# Patient Record
Sex: Female | Born: 1935 | Race: White | Hispanic: No | Marital: Single | Smoking: Former smoker
Health system: Southern US, Community
[De-identification: ages and names within clinical notes are randomized; demographics above are authoritative.]

## PROBLEM LIST (undated history)

## (undated) DIAGNOSIS — T7840XA Allergy, unspecified, initial encounter: Secondary | ICD-10-CM

## (undated) DIAGNOSIS — I10 Essential (primary) hypertension: Secondary | ICD-10-CM

## (undated) DIAGNOSIS — Z862 Personal history of diseases of the blood and blood-forming organs and certain disorders involving the immune mechanism: Secondary | ICD-10-CM

## (undated) DIAGNOSIS — A499 Bacterial infection, unspecified: Secondary | ICD-10-CM

## (undated) DIAGNOSIS — R32 Unspecified urinary incontinence: Secondary | ICD-10-CM

## (undated) DIAGNOSIS — B379 Candidiasis, unspecified: Secondary | ICD-10-CM

## (undated) DIAGNOSIS — I8390 Asymptomatic varicose veins of unspecified lower extremity: Secondary | ICD-10-CM

## (undated) DIAGNOSIS — IMO0002 Reserved for concepts with insufficient information to code with codable children: Secondary | ICD-10-CM

## (undated) DIAGNOSIS — E079 Disorder of thyroid, unspecified: Secondary | ICD-10-CM

## (undated) DIAGNOSIS — D649 Anemia, unspecified: Secondary | ICD-10-CM

## (undated) HISTORY — DX: Reserved for concepts with insufficient information to code with codable children: IMO0002

## (undated) HISTORY — DX: Essential (primary) hypertension: I10

## (undated) HISTORY — PX: JOINT REPLACEMENT: SHX530

## (undated) HISTORY — DX: Candidiasis, unspecified: B37.9

## (undated) HISTORY — DX: Asymptomatic varicose veins of unspecified lower extremity: I83.90

## (undated) HISTORY — PX: OVARIAN CYST REMOVAL: SHX89

## (undated) HISTORY — DX: Allergy, unspecified, initial encounter: T78.40XA

## (undated) HISTORY — DX: Unspecified urinary incontinence: R32

## (undated) HISTORY — PX: TONSILLECTOMY: SUR1361

## (undated) HISTORY — DX: Personal history of diseases of the blood and blood-forming organs and certain disorders involving the immune mechanism: Z86.2

## (undated) HISTORY — DX: Anemia, unspecified: D64.9

## (undated) HISTORY — PX: ABDOMINAL HYSTERECTOMY: SHX81

## (undated) HISTORY — DX: Bacterial infection, unspecified: A49.9

## (undated) HISTORY — DX: Disorder of thyroid, unspecified: E07.9

---

## 2003-10-04 ENCOUNTER — Encounter: Admission: RE | Admit: 2003-10-04 | Discharge: 2003-10-04 | Payer: Self-pay | Admitting: Family Medicine

## 2004-05-23 ENCOUNTER — Ambulatory Visit: Payer: Self-pay | Admitting: Family Medicine

## 2004-06-13 ENCOUNTER — Ambulatory Visit: Payer: Self-pay | Admitting: Family Medicine

## 2004-08-30 ENCOUNTER — Ambulatory Visit: Payer: Self-pay | Admitting: Internal Medicine

## 2004-09-03 ENCOUNTER — Ambulatory Visit: Payer: Self-pay | Admitting: Internal Medicine

## 2005-04-05 ENCOUNTER — Encounter (INDEPENDENT_AMBULATORY_CARE_PROVIDER_SITE_OTHER): Payer: Self-pay | Admitting: Specialist

## 2005-04-05 ENCOUNTER — Inpatient Hospital Stay (HOSPITAL_COMMUNITY): Admission: EM | Admit: 2005-04-05 | Discharge: 2005-04-07 | Payer: Self-pay | Admitting: Emergency Medicine

## 2005-04-05 ENCOUNTER — Ambulatory Visit: Payer: Self-pay | Admitting: Internal Medicine

## 2005-06-12 ENCOUNTER — Ambulatory Visit: Payer: Self-pay | Admitting: Internal Medicine

## 2005-06-14 ENCOUNTER — Ambulatory Visit: Payer: Self-pay | Admitting: Internal Medicine

## 2006-12-11 ENCOUNTER — Encounter (INDEPENDENT_AMBULATORY_CARE_PROVIDER_SITE_OTHER): Payer: Self-pay | Admitting: *Deleted

## 2007-04-10 ENCOUNTER — Ambulatory Visit: Payer: Self-pay | Admitting: Internal Medicine

## 2007-04-10 DIAGNOSIS — E785 Hyperlipidemia, unspecified: Secondary | ICD-10-CM

## 2007-04-10 DIAGNOSIS — I10 Essential (primary) hypertension: Secondary | ICD-10-CM

## 2007-04-10 DIAGNOSIS — M1990 Unspecified osteoarthritis, unspecified site: Secondary | ICD-10-CM | POA: Insufficient documentation

## 2007-04-10 DIAGNOSIS — E039 Hypothyroidism, unspecified: Secondary | ICD-10-CM

## 2007-04-13 ENCOUNTER — Encounter (INDEPENDENT_AMBULATORY_CARE_PROVIDER_SITE_OTHER): Payer: Self-pay | Admitting: *Deleted

## 2007-04-13 LAB — CONVERTED CEMR LAB
Calcium: 9.3 mg/dL (ref 8.4–10.5)
Chloride: 101 meq/L (ref 96–112)
Potassium: 4 meq/L (ref 3.5–5.1)
TSH: 2.39 microintl units/mL (ref 0.35–5.50)

## 2007-10-09 ENCOUNTER — Telehealth (INDEPENDENT_AMBULATORY_CARE_PROVIDER_SITE_OTHER): Payer: Self-pay | Admitting: *Deleted

## 2007-12-30 ENCOUNTER — Encounter: Admission: RE | Admit: 2007-12-30 | Discharge: 2007-12-30 | Payer: Self-pay | Admitting: General Surgery

## 2007-12-31 ENCOUNTER — Encounter (INDEPENDENT_AMBULATORY_CARE_PROVIDER_SITE_OTHER): Payer: Self-pay | Admitting: General Surgery

## 2007-12-31 ENCOUNTER — Ambulatory Visit (HOSPITAL_BASED_OUTPATIENT_CLINIC_OR_DEPARTMENT_OTHER): Admission: RE | Admit: 2007-12-31 | Discharge: 2007-12-31 | Payer: Self-pay | Admitting: General Surgery

## 2010-04-22 ENCOUNTER — Encounter: Payer: Self-pay | Admitting: Family Medicine

## 2010-08-14 NOTE — Op Note (Signed)
Olivia Robinson, MACRAE NO.:  [id number]   MEDICAL RECORD NO.:  [id number]          PATIENT TYPE:  AMB   LOCATION:  DSC                          FACILITY:  [facility]   PHYSICIAN:  Juanetta Gosling, MDDATE OF BIRTH:  [birth year]   DATE OF PROCEDURE:  12/31/2007  DATE OF DISCHARGE:                               OPERATIVE REPORT   PREOPERATIVE DIAGNOSIS:  Hemorrhoids.   POSTOPERATIVE DIAGNOSES:  External and internal hemorrhoidal disease.   PROCEDURE:  Exam under anesthesia, internal hemorrhoidectomy, excision  of external anal skin tag, and banding of single internal hemorrhoid.   SURGEON:  Troy Sine. Dwain Sarna, MD   ASSISTANT:  Amber L. Freida Busman, MD   ANESTHESIA:  General.   Position prone jackknife .   SPECIMENS:  Hemorrhoid to pathology.   ESTIMATED BLOOD LOSS:  Minimal.   COMPLICATIONS:  None.   DRAINS:  None.   DISPOSITION:  To PACU in stable condition.   INDICATION:  Olivia Robinson is a 76 year old female who for number of years  has had occasionally prolapsing internal hemorrhoids that very  occasionally required manual reduction.  Her main compliant she also had  difficulty with anal hygiene associated with an area that has increased  in size during this time.  On her exam in the clinic, she had a large  anal skin tag in the right anterior position and on her anoscopy.  She  has internal hemorrhoids in all 3 positions that are moderate in size.  She did not have mass or any blood on digital rectal examination.  I  counseled her for exam under anesthesia, excision of a skin tag,  possible internal hemorrhoidectomy or a hemorrhoidal banding.   PROCEDURE:  After informed consent was obtained, the patient was taken  to the operating room where she was placed under general anesthesia  without complication.  Sequential compressive device was placed on her  lower extremities.  She was then rolled into prone jackknife position  and appropriately padded.  Her  buttocks were then taped apart and exam  was performed.  She had noted that she had a prolapsed right anterior  and right posterior internal hemorrhoid as well as an external skin tag  associated in the posterior position today.  Her anus and buttocks were  then prepped and draped in a standard sterile surgical fashion.  Anoscopy was then performed with the same findings as above.  There were  no masses noted with this.  I elected to excise the right posterior  internal hemorrhoid along with the external component along with this  including the skin tag.  I put a 2-0 chromic stitch at the apex of this  and then removed the hemorrhoidal tissue off the sphincter muscle  without difficulty.  This was passed off the table as a specimen.  I  then oversewed the area with a chromic leaving the most distal portion  of this open.  Following this, I reexamined the other area that seemed  to be bothering her the most that was in the right anterior position.  There was a large internal hemorrhoid associated with  this with a small  external tag today.  I elected to rubber band this hemorrhoidal complex  and did so without complication.  She does have hemorrhoids in other  positions and has some other external disease as well, but I think these  2 areas were the areas that were bothering her the most and did not want  to pursue treatment of any of these others at this time.  She did insert  a piece of Gelfoam into her anus and then performed an anal block with  0.25% Marcaine following the completion of the procedure.  She tolerated  this well, was rolled supine and extubated in the operating room and  transferred to the PACU in stable condition.  She would return to my  office in 2-3 weeks for reevaluation as she may yet require further  banding at that time or later in the future just depending on what her  symptoms will be like, but I think she will be considerably better from  this.       Juanetta Gosling, MD  Electronically Signed     MCW/MEDQ  D:  12/31/2007  T:  01/01/2008  Job:  [id number]   cc:   Clydie Braun L. Hal Hope, M.D.

## 2010-08-17 NOTE — Discharge Summary (Signed)
[name], Robinson NO.:  [id number]   MEDICAL RECORD NO.:  [id number]          PATIENT TYPE:  INP   LOCATION:  1617                         FACILITY:  [facility]   PHYSICIAN:  Rosalyn Gess. Norins, M.D. LHCDATE OF BIRTH:  [birth year]   DATE OF ADMISSION:  04/05/2005  DATE OF DISCHARGE:  04/07/2005                                 DISCHARGE SUMMARY   ADMITTING DIAGNOSES:  1.  Lower gastrointestinal bleed.  2.  Hypertension.   DISCHARGE DIAGNOSES:  1.  Diarrhea secondary to either ischemic colitis versus infectious colitis.  2.  Hypertension, controlled.   CONSULTANTS:  Dr. Lina Sar for GI.   PROCEDURES:  None.   HISTORY OF PRESENT ILLNESS:  Olivia Robinson is a 76 year old woman with for 4-  day history of frequent liquid stools with mucus and increasing lower  abdominal pain and discomfort. On the morning of admission she had a dark  red, bloody stools with clots. She presented to the emergency department for  evaluation at that point.   Past medical history, family history, social history are documented in the  admission note.   HOSPITAL COURSE:  1.  Gastrointestinal.  The patient was admitted with probable lower GI      bleed. Her hemoglobin at initial presentation was 13.5 g and fell with a      nadir of 11.1 grams. After admission the patient had no further      hematochezia.   The patient was seen in consultation by Dr. Lina Sar.  Concern was for  ischemic colitis versus viral gastroenteritis with possible cause of  ischemia versus C. difficile.  Stool cultures were obtained and at time of  discharge one for two was negative for C. difficile, one pending. Stool was  negative for wbc's or ova and parasites.   With the patient taking a diet of full liquids without difficulty with no  recurrent lower GI bleed, she is felt to be stable for discharge home and to  continue empiric treatment with of Flagyl and Cipro.   1.  Hypertension. The patient was  continued on her home medications and her      blood pressure was stable.   DISCHARGE EXAMINATION:  VITAL SIGNS:  Temperature of 97, blood pressure  99/55, heart rate 57, respirations 16.  GENERAL APPEARANCE:  This is an overweight Caucasian woman sitting on the  side of the bed in no acute distress.  CHEST: Clear.  ABDOMEN: Soft, no guarding or rebound.   No further exam conducted.   FINAL LABORATORY:  Hemoglobin 11.1, hematocrit 32.8 on the day of discharge.  final chemistries from April 06, 2005 were unremarkable with a creatinine  of 1.1. Stool studies were negative as noted. Liver functions at admission  were stable.   DISPOSITION:  The patient is discharged home. She will continue on her home  medications include lisinopril 20 milligrams daily, hydrochlorothiazide 12.5  milligrams daily, Synthroid 100 mcg daily. She will continue on Flagyl 250  milligrams t.i.d. for 7 days, Cipro 500 milligrams b.i.d. for 7 days. The  patient will see her primary care  physician, Dr. Drue Novel in followup. She will  see Dr. Lina Sar for followup as instructed.   The patient's condition at time of discharge dictation is stable and  improved.           ______________________________  Rosalyn Gess Norins, M.D. [facility]     MEN/MEDQ  D:  04/07/2005  T:  04/08/2005  Job:  [id number]   cc:   Wanda Plump, MD [facility]  [phone] [place], [place] [address]   Lina Sar, M.D. [facility]  [address]  [place]  [place] [address]

## 2010-10-26 ENCOUNTER — Other Ambulatory Visit: Payer: Self-pay | Admitting: Orthopaedic Surgery

## 2010-10-26 ENCOUNTER — Other Ambulatory Visit (HOSPITAL_COMMUNITY): Payer: Self-pay | Admitting: Orthopaedic Surgery

## 2010-10-26 ENCOUNTER — Encounter (HOSPITAL_COMMUNITY): Payer: Medicare Other

## 2010-10-26 ENCOUNTER — Ambulatory Visit (HOSPITAL_COMMUNITY)
Admission: RE | Admit: 2010-10-26 | Discharge: 2010-10-26 | Disposition: A | Discharge status: Home / Self Care | Payer: Medicare Other | Source: Ambulatory Visit | Attending: Orthopaedic Surgery | Admitting: Orthopaedic Surgery

## 2010-10-26 DIAGNOSIS — Z01812 Encounter for preprocedural laboratory examination: Secondary | ICD-10-CM | POA: Insufficient documentation

## 2010-10-26 DIAGNOSIS — Z01810 Encounter for preprocedural cardiovascular examination: Secondary | ICD-10-CM | POA: Insufficient documentation

## 2010-10-26 DIAGNOSIS — Z87891 Personal history of nicotine dependence: Secondary | ICD-10-CM | POA: Insufficient documentation

## 2010-10-26 DIAGNOSIS — Z01818 Encounter for other preprocedural examination: Secondary | ICD-10-CM

## 2010-10-26 DIAGNOSIS — I10 Essential (primary) hypertension: Secondary | ICD-10-CM | POA: Insufficient documentation

## 2010-10-26 DIAGNOSIS — I498 Other specified cardiac arrhythmias: Secondary | ICD-10-CM | POA: Insufficient documentation

## 2010-10-26 LAB — URINALYSIS, ROUTINE W REFLEX MICROSCOPIC
Glucose, UA: NEGATIVE mg/dL
Hgb urine dipstick: NEGATIVE
Ketones, ur: NEGATIVE mg/dL
Protein, ur: NEGATIVE mg/dL
Urobilinogen, UA: 0.2 mg/dL (ref 0.0–1.0)
pH: 5.5 (ref 5.0–8.0)

## 2010-10-26 LAB — BASIC METABOLIC PANEL
CO2: 27 mEq/L (ref 19–32)
GFR calc Af Amer: >60 mL/min (ref >60)
GFR calc non Af Amer: >60 mL/min (ref >60)

## 2010-10-26 LAB — CBC
HCT: 42.4 % (ref 36.0–46.0)
Hemoglobin: 13.4 g/dL (ref 12.0–15.0)
MCH: 28.6 pg (ref 26.0–34.0)
MCHC: 31.6 g/dL (ref 30.0–36.0)
MCV: 90.4 fL (ref 78.0–100.0)

## 2010-10-26 LAB — URINE MICROSCOPIC-ADD ON

## 2010-11-02 ENCOUNTER — Inpatient Hospital Stay (HOSPITAL_COMMUNITY): Payer: Medicare Other

## 2010-11-02 ENCOUNTER — Inpatient Hospital Stay (HOSPITAL_COMMUNITY)
Admission: RE | Admit: 2010-11-02 | Discharge: 2010-11-06 | DRG: 470 | Disposition: A | Discharge status: Skilled Nursing Facility | Payer: Medicare Other | Source: Ambulatory Visit | Attending: Orthopaedic Surgery | Admitting: Orthopaedic Surgery

## 2010-11-02 DIAGNOSIS — M169 Osteoarthritis of hip, unspecified: Principal | ICD-10-CM | POA: Diagnosis present

## 2010-11-02 DIAGNOSIS — Z01812 Encounter for preprocedural laboratory examination: Secondary | ICD-10-CM

## 2010-11-02 DIAGNOSIS — D62 Acute posthemorrhagic anemia: Secondary | ICD-10-CM | POA: Diagnosis not present

## 2010-11-02 DIAGNOSIS — E039 Hypothyroidism, unspecified: Secondary | ICD-10-CM | POA: Diagnosis present

## 2010-11-02 DIAGNOSIS — I10 Essential (primary) hypertension: Secondary | ICD-10-CM | POA: Diagnosis present

## 2010-11-02 DIAGNOSIS — M1610 Unilateral primary osteoarthritis, unspecified hip: Principal | ICD-10-CM | POA: Diagnosis present

## 2010-11-02 LAB — TYPE AND SCREEN
ABO/RH(D): A NEG
Antibody Screen: NEGATIVE

## 2010-11-03 LAB — BASIC METABOLIC PANEL
BUN: 16 mg/dL (ref 6–23)
Chloride: 104 mEq/L (ref 96–112)
Creatinine, Ser: 0.65 mg/dL (ref 0.50–1.10)
Glucose, Bld: 136 mg/dL — ABNORMAL HIGH (ref 70–99)
Potassium: 4.5 mEq/L (ref 3.5–5.1)
Sodium: 137 mEq/L (ref 135–145)

## 2010-11-03 LAB — CBC
HCT: 30.7 % — ABNORMAL LOW (ref 36.0–46.0)
MCHC: 33.6 g/dL (ref 30.0–36.0)
Platelets: 279 10*3/uL (ref 150–400)

## 2010-11-04 LAB — CBC
HCT: 32.4 % — ABNORMAL LOW (ref 36.0–46.0)
Hemoglobin: 10.2 g/dL — ABNORMAL LOW (ref 12.0–15.0)
MCH: 28.8 pg (ref 26.0–34.0)
MCHC: 31.5 g/dL (ref 30.0–36.0)
MCV: 91.5 fL (ref 78.0–100.0)
Platelets: 293 K/uL (ref 150–400)
RBC: 3.54 MIL/uL — ABNORMAL LOW (ref 3.87–5.11)
RDW: 14.5 % (ref 11.5–15.5)
WBC: 10.3 K/uL (ref 4.0–10.5)

## 2010-11-04 LAB — BASIC METABOLIC PANEL WITH GFR
BUN: 15 mg/dL (ref 6–23)
CO2: 27 meq/L (ref 19–32)
Calcium: 9.5 mg/dL (ref 8.4–10.5)
Chloride: 104 meq/L (ref 96–112)
Creatinine, Ser: 0.72 mg/dL (ref 0.50–1.10)
GFR calc Af Amer: >60 mL/min
GFR calc non Af Amer: >60 mL/min
Glucose, Bld: 118 mg/dL — ABNORMAL HIGH (ref 70–99)
Potassium: 3.9 meq/L (ref 3.5–5.1)
Sodium: 140 meq/L (ref 135–145)

## 2010-11-05 LAB — CBC
HCT: 28.4 % — ABNORMAL LOW (ref 36.0–46.0)
Hemoglobin: 9.4 g/dL — ABNORMAL LOW (ref 12.0–15.0)
MCH: 29.7 pg (ref 26.0–34.0)
MCV: 89.9 fL (ref 78.0–100.0)
RBC: 3.16 MIL/uL — ABNORMAL LOW (ref 3.87–5.11)

## 2010-11-05 LAB — BASIC METABOLIC PANEL
BUN: 15 mg/dL (ref 6–23)
Calcium: 9 mg/dL (ref 8.4–10.5)
Glucose, Bld: 121 mg/dL — ABNORMAL HIGH (ref 70–99)

## 2010-11-08 NOTE — H&P (Signed)
  NAMEMURLEAN, Olivia Robinson NO.:  [id number]  MEDICAL RECORD NO.:  [id number]  LOCATION:  1614                         FACILITY:  [facility]  PHYSICIAN:  Vanita Panda. Magnus Ivan, M.D.DATE OF BIRTH:  01/03/1935  DATE OF ADMISSION:  11/02/2010 DATE OF DISCHARGE:                             HISTORY & PHYSICAL   CHIEF COMPLAINT:  Severe left hip pain and known severe osteoarthritis.  HISTORY OF PRESENT ILLNESS:  Ms. Olivia Robinson is a 76 year old female with debilitating arthritis involving her left hip.  She has had a steroid injection in that hip and it has helped a little, but now it has gotten where it is greatly affecting her activities of daily living.  Her left hip hurts daily.  It is affecting her activities of daily living in terms of when she walks and sleeps and getting up from a sitting position.  Driving has been a problem as well.  She now wishes to proceed with a total hip arthroplasty.  She understands the risks and benefits of this and we have talked in the office about this in length.  PAST MEDICAL HISTORY: 1. High blood pressure. 2. Thyroid disease.  MEDICATIONS:  Synthroid and lisinopril.  ALLERGIES:  CLINDAMYCIN.  FAMILY MEDICAL HISTORY:  Heart disease.  SOCIAL HISTORY:  She is divorced.  She does not smoke and does not drink.  Lives alone.  REVIEW OF SYSTEMS:  Negative for chest pain, shortness of breath, fever, chills, nausea, and vomiting.  PHYSICAL EXAMINATION:  VITAL SIGNS:  She is afebrile.  Stable vital signs. GENERAL:  She is alert, oriented x3, in no acute distress. HEENT:  Normocephalic, atraumatic.  Pupils are equal, round, and reactive to light.  Extraocular muscles intact. NECK:  Supple.  No JVD.  No bruits. LUNGS:  Clear to auscultation bilaterally. HEART:  Regular rate and rhythm. ABDOMEN:  Benign. EXTREMITIES:  Left hip shows severe pain with internal and external rotation.  LABORATORY DATA:  X-rays of her left confirmed  end-stage arthritis of the left hip.  ASSESSMENT:  This is a very pleasant 76 year old female with end-stage arthritis of the left hip.  PLAN:  We will proceed today with a total hip arthroplasty and then she will admitted as an inpatient.  Again, she understands the risks of acute blood loss anemia, DVT, and PE, and she does wish to proceed with surgery.     Vanita Panda. Magnus Ivan, M.D.     CYB/MEDQ  D:  11/02/2010  T:  11/03/2010  Job:  [id number]  Electronically Signed by Doneen Poisson M.D. on 11/08/2010 11:41:48 PM

## 2010-11-08 NOTE — Op Note (Signed)
Olivia Robinson, Olivia Robinson NO.:  [id number]  MEDICAL RECORD NO.:  [id number]  LOCATION:  1614                         FACILITY:  [facility]  PHYSICIAN:  Vanita Panda. Magnus Ivan, M.D.DATE OF BIRTH:  [birth year]  DATE OF PROCEDURE:  11/02/2010 DATE OF DISCHARGE:                              OPERATIVE REPORT   PREOPERATIVE DIAGNOSIS:  Severe osteoarthritis and degenerative joint disease, left  hip.  POSTOPERATIVE DIAGNOSIS:  Severe osteoarthritis and degenerative joint disease, left  hip.  PROCEDURE:  Left total hip arthroplasty through direct anterior approach.  IMPLANTS:  DePuy Pinn[employer] Sector acetabular component with Gription size 52, size 36 +4 neutral polyethylene liner, Corail femoral component size 10 with standard offset and HA-coating, size 36 -2 metal hip ball.  SURGEON:  Vanita Panda. Magnus Ivan, M.D.  ASSISTANT:  Wende Neighbors, P.A.  BLOOD LOSS:  700 mL.  ANTIBIOTICS:  IV Ancef.  COMPLICATIONS:  None.  INDICATION:  Olivia Robinson is a 76 year old female with debilitating arthritis involving her left hip, this is effecting her activities of daily living.  It has gotten to the point  where she wished to proceed with a total hip arthroplasty.  She understands the risk and benefits of this including the risk of acute blood loss anemia, DVT, and PE.  PROCEDURE DESCRIPTION:  After informed consent was obtained, the appropriate left hip was marked.  She was brought to the operating room and general anesthesia was obtained while she was on a stretcher.  A Foley catheter was placed and then both feet were placed in traction boots.  She was then placed on the Hana operative table with perineal post in place in both legs in midline skeletal traction with no traction applied.  Her left hip was then prepped and draped with DuraPrep and sterile drapes.  A time-out was called.  She was identified as the correct patient, and correct left hip.  I then made an  incision 1 cm distal and around 3 cm posterior to the anterior-superior iliac spine and carried this obliquely down the leg.  I  was able to dissect down to the tensor fascia lata.  The tensor fascia lata was divided longitudinally.  I then proceed with a direct anterior approach to the hip.  A Cobra retractor was placed around the lateral neck and then medial up underneath the reflected head of the rectus femoris.  I cauterized the lateral femoral circumflex vessels.  I then was then able to divide the hip capsule in a T-type format and found a large joint effusion.  Under direct fluoroscopic guidance, we then assessed the level of femoral neck cut, and then on direct visualization, I made a femoral neck cut.  I placed a corkscrew into the femoral head and removed the femoral head without problems.  I then cleaned the acetabulum of debris.  We placed the Hohmann retractor anteriorly and the Cobra rectractor posteriorly down the acetabulum.  I then began reaming from size 42 all the way up to a size of 51 reamer with the last 2 reamers placed under direct fluoroscopy.  Under direct visualization of fluoroscopy,  I was then able to place the size 52 acetabular component with Gription.  We placed the hole eliminator and then the real 36 +4 neutral polyethylene liner.  Next, the attention was turned to the femur with all the traction off.  We placed hook underneath the muscle around the femur supported.  We externally rotated to 90 degrees, extended and adducted the hip to allow exposure to the femoral canal with Mueller retractor placed in the medial calcar and Hohmann retractor underneath the greater trochanter.  I was able to release the lateral capsule and bring this anterior; I then was able to release soft tissue from around the greater trochanter to allow exposure to the femoral canal.  I then used the box cutting guide followed by rongeur and then we began broaching from the size 8  broach up to the 10  broach; the 10 broach was found to be most stable.  I trialed a trial 36 +1.5 hip ball, but it was too long, with the -2 trial hip ball, we reduced this in the acetabulum and leg lengths were equal and it was felt that she was otherwise stable with external and internal rotation with no shuck.  I then removed all trial instrumentation.  After dislocating the hip again, we placed a real size 10 femoral component followed by the real 36 -2 metal hip ball.  We reduced this back in the acetabulum and again it was stable with no shuck and stayed in passed 45 degrees of internal rotation and 90 degrees of external rotation.  All the final fluoroscopy pictures showed good placement of the implants well.  We then irrigated the tissues, closed the capsule with interrupted #1 Ethibond suture followed by a running #1 Vicryl in the tensor fascia lata, 2-0 Vicryl in the subcutaneous tissue, and staples on the skin.  Xeroform followed by well-padded sterile dressing was applied.  She was taken off the Hana table, awakened, extubated, and taken to the recovery room in stable condition.  All final counts were correct, and there were no complications noted.  Wende Neighbors, P.A., was all involved during this case.  She was there from incision to closure and was integral in helping get the implants in a proper position.     Vanita Panda. Magnus Ivan, M.D.     CYB/MEDQ  D:  11/02/2010  T:  11/03/2010  Job:  [id number]  Electronically Signed by Doneen Poisson M.D. on 11/08/2010 11:41:46 PM

## 2010-11-23 NOTE — Discharge Summary (Signed)
Olivia Robinson, Olivia NO.:  [id number]  MEDICAL RECORD NO.:  [id number]  LOCATION:  1614                         FACILITY:  [facility]  PHYSICIAN:  Vanita Panda. Magnus Ivan, M.D.DATE OF BIRTH:  1934/05/13  DATE OF ADMISSION:  11/02/2010 DATE OF DISCHARGE:  11/06/2010                              DISCHARGE SUMMARY   ADMISSION DIAGNOSES: 1. Severe osteoarthritis of the left hip. 2. Hypertension. 3. Thyroid disease.  DISCHARGE DIAGNOSES: 1. Severe osteoarthritis of the left hip. 2. Hypertension. 3. Thyroid disease. 4. Posthemorrhagic anemia.  PROCEDURE:  On November 02, 2010, the patient underwent left total hip arthroplasty through direct anterior approach performed by Dr. Magnus Ivan, assisted by Maud Deed, PA-C under general anesthesia.  CONSULTATIONS:  None.  BRIEF HISTORY:  The patient is a 76 year old female with debilitating arthritis of her left hip who has osteoarthritis and degenerative joint disease.  She has difficulty with her usual activities.  She has failed conservative treatment.  After risks and benefits were discussed with the patient, she wished to proceed with a left total hip arthroplasty.  BRIEF HOSPITAL COURSE:  The patient tolerated the procedure under general anesthesia without complications.  Postoperatively, neurovascular motor function of the lower extremities was noted to be intact.  The patient was started on physical therapy for ambulation and gait training, weightbearing as tolerated on the operative extremity. No hip replacement precautions were necessary secondary to direct anterior approach.  Postoperative hemoglobin dropped to the lowest value at 9.4 and 28.4 and the patient did not require blood transfusion. Chemistry studies remained stable throughout the hospital stay.  The patient was placed on Xarelto for DVT prophylaxis.  She will continue on Xarelto for a total of 14 days postoperatively.  Dressing changes were done  daily and her wound was healing well without drainage or signs of infection.  She was afebrile and vital signs were stable through the hospital stay.  The patient was able to take a regular diet.  PCA analgesics were used initially and she was weaned to p.o. analgesics without difficulty.  Foley catheter was discontinued and the patient was voiding independently.  She was concerned about returning to her home on an independent level.  It was felt that she would best benefit from a short-term nursing home placement for rehabilitation.  The patient did ambulate 150 feet while in the hospital utilizing a rolling walker.  She did now require assistance with ADLs.  As there was no assistance available at home, arrangements were made for nursing home placement with the assistance of the case managers.  The patient was stable for discharge to the nursing facility for continued rehab.  PLAN:  The patient be transferred to a nursing facility for continued rehabilitation.  There, she should continue to be weightbearing as tolerated on the operative extremity.  No total hip replacement precautions.  The patient will utilize a walker for ambulation. Incision should remain dry and clean until November 07, 2010.  If there is no drainage from the wound, she may have this wet in the shower.  Prior to that time, if she does require hygiene in the shower, the wound should be covered with Tegaderm dressing at all times  with the dressing removed and replaced afterwards.  Daily dressing changes will be required for a total of 2 weeks, at which time, staples will be removed. She will continue on a regular diet.  The patient will follow up with Dr. Magnus Ivan in 2 weeks from the date of surgery.  MEDICATIONS AT DISCHARGE: 1. Xarelto 10 mg daily x14 days, start date November 03, 2010. 2. Colace one p.o. b.i.d. 3. Ferrous sulfate 325 mg t.i.d. 4. Claritin 10 mg p.o. at bedtime. 5. Vitamin D3 1000 units b.i.d. 6.  Synthroid 125 mcg daily. 7. Lisinopril 20 mg daily. 8. Calcium 500 mg daily. 9. Slow-Mag 64 mg daily. 10.Zinc sulfate 020 mg daily. 11.MiraLax daily. 12.Percocet 5/325 one to two every 4-6 hours as needed for pain. 13.Robaxin 500 mg one every 8 hours as needed for spasm. The patient should follow up with Dr. Magnus Ivan in 2 weeks from the date of surgery.  All questions encouraged and answered.  CONDITION ON DISCHARGE:  Stable.     Wende Neighbors, P.A.   ______________________________ Vanita Panda. Magnus Ivan, M.D.    SMV/MEDQ  D:  11/05/2010  T:  11/05/2010  Job:  [id number]  cc:   Vanita Panda. Magnus Ivan, M.D. Fax: [phone]  Electronically Signed by Doneen Poisson M.D. on 11/23/2010 06:54:54 PM

## 2010-12-31 LAB — DIFFERENTIAL
Basophils Absolute: 0
Basophils Relative: 1
Eosinophils Absolute: 0.2
Eosinophils Relative: 2
Lymphocytes Relative: 25
Lymphs Abs: 2
Monocytes Absolute: 0.4
Monocytes Relative: 5
Neutro Abs: 5.6
Neutrophils Relative %: 67

## 2010-12-31 LAB — CBC
Platelets: 305
RDW: 14.4

## 2010-12-31 LAB — BASIC METABOLIC PANEL
BUN: 20
CO2: 27
Calcium: 9.3
Chloride: 102
Creatinine, Ser: 1.07
GFR calc non Af Amer: 51 — ABNORMAL LOW
Potassium: 4.8
Sodium: 138

## 2011-01-01 LAB — POCT HEMOGLOBIN-HEMACUE: Hemoglobin: 14

## 2011-04-03 DIAGNOSIS — N8111 Cystocele, midline: Secondary | ICD-10-CM | POA: Diagnosis not present

## 2011-04-03 DIAGNOSIS — N814 Uterovaginal prolapse, unspecified: Secondary | ICD-10-CM | POA: Diagnosis not present

## 2011-04-03 DIAGNOSIS — N390 Urinary tract infection, site not specified: Secondary | ICD-10-CM | POA: Diagnosis not present

## 2011-04-08 DIAGNOSIS — N8111 Cystocele, midline: Secondary | ICD-10-CM | POA: Diagnosis not present

## 2011-04-08 DIAGNOSIS — N816 Rectocele: Secondary | ICD-10-CM | POA: Diagnosis not present

## 2011-04-21 DIAGNOSIS — J019 Acute sinusitis, unspecified: Secondary | ICD-10-CM | POA: Diagnosis not present

## 2011-04-21 DIAGNOSIS — J209 Acute bronchitis, unspecified: Secondary | ICD-10-CM | POA: Diagnosis not present

## 2011-05-09 DIAGNOSIS — N814 Uterovaginal prolapse, unspecified: Secondary | ICD-10-CM | POA: Diagnosis not present

## 2011-05-09 DIAGNOSIS — N8111 Cystocele, midline: Secondary | ICD-10-CM | POA: Diagnosis not present

## 2011-05-09 DIAGNOSIS — N816 Rectocele: Secondary | ICD-10-CM | POA: Diagnosis not present

## 2011-05-15 DIAGNOSIS — N3946 Mixed incontinence: Secondary | ICD-10-CM | POA: Diagnosis not present

## 2011-05-15 DIAGNOSIS — N816 Rectocele: Secondary | ICD-10-CM | POA: Diagnosis not present

## 2011-05-15 DIAGNOSIS — N8111 Cystocele, midline: Secondary | ICD-10-CM | POA: Diagnosis not present

## 2011-05-15 DIAGNOSIS — R32 Unspecified urinary incontinence: Secondary | ICD-10-CM | POA: Diagnosis not present

## 2011-06-07 ENCOUNTER — Encounter: Payer: Self-pay | Admitting: Obstetrics and Gynecology

## 2011-06-18 DIAGNOSIS — N8189 Other female genital prolapse: Secondary | ICD-10-CM

## 2011-06-18 DIAGNOSIS — N393 Stress incontinence (female) (male): Secondary | ICD-10-CM | POA: Insufficient documentation

## 2011-06-27 ENCOUNTER — Ambulatory Visit: Payer: Medicare Other

## 2011-06-30 ENCOUNTER — Ambulatory Visit: Payer: Medicare Other

## 2011-06-30 ENCOUNTER — Ambulatory Visit (INDEPENDENT_AMBULATORY_CARE_PROVIDER_SITE_OTHER): Payer: Medicare Other | Admitting: Emergency Medicine

## 2011-06-30 DIAGNOSIS — J159 Unspecified bacterial pneumonia: Secondary | ICD-10-CM | POA: Diagnosis not present

## 2011-06-30 DIAGNOSIS — R05 Cough: Secondary | ICD-10-CM

## 2011-06-30 DIAGNOSIS — I10 Essential (primary) hypertension: Secondary | ICD-10-CM

## 2011-06-30 DIAGNOSIS — J189 Pneumonia, unspecified organism: Secondary | ICD-10-CM

## 2011-06-30 DIAGNOSIS — E119 Type 2 diabetes mellitus without complications: Secondary | ICD-10-CM | POA: Diagnosis not present

## 2011-06-30 LAB — COMPREHENSIVE METABOLIC PANEL
Albumin: 4.3 g/dL (ref 3.5–5.2)
BUN: 23 mg/dL (ref 6–23)
CO2: 23 mEq/L (ref 19–32)
Glucose, Bld: 148 mg/dL — ABNORMAL HIGH (ref 70–99)
Potassium: 4.6 mEq/L (ref 3.5–5.3)
Sodium: 136 mEq/L (ref 135–145)
Total Bilirubin: 0.4 mg/dL (ref 0.3–1.2)
Total Protein: 7 g/dL (ref 6.0–8.3)

## 2011-06-30 LAB — POCT CBC
Lymph, poc: 2 (ref 0.6–3.4)
MCH, POC: 28.3 pg (ref 27–31.2)
MCHC: 31.2 g/dL — AB (ref 31.8–35.4)
MCV: 90.5 fL (ref 80–97)
MID (cbc): 0.6 (ref 0–0.9)
POC MID %: 8 %M (ref 0–12)
Platelet Count, POC: 284 10*3/uL (ref 142–424)
RBC: 4.56 M/uL (ref 4.04–5.48)
WBC: 7.2 10*3/uL (ref 4.6–10.2)

## 2011-06-30 LAB — LIPID PANEL
Cholesterol: 247 mg/dL — ABNORMAL HIGH (ref 0–200)
Triglycerides: 301 mg/dL — ABNORMAL HIGH (ref <150)

## 2011-06-30 LAB — TSH: TSH: 1.489 u[IU]/mL (ref 0.350–4.500)

## 2011-06-30 MED ORDER — LEVOTHYROXINE SODIUM 125 MCG PO TABS: 125.0000 ug | ORAL_TABLET | Freq: Every day | ORAL | Status: DC

## 2011-06-30 MED ORDER — ALLOPURINOL 300 MG PO TABS: 300.0000 mg | ORAL_TABLET | Freq: Every day | ORAL | Status: DC

## 2011-06-30 MED ORDER — BENZONATATE 100 MG PO CAPS: 200.0000 mg | ORAL_CAPSULE | Freq: Three times a day (TID) | ORAL | Status: AC | PRN

## 2011-06-30 MED ORDER — ALBUTEROL SULFATE (2.5 MG/3ML) 0.083% IN NEBU
2.5000 mg | INHALATION_SOLUTION | Freq: Once | RESPIRATORY_TRACT | Status: AC
  Administered 2011-06-30: 2.5 mg via RESPIRATORY_TRACT

## 2011-06-30 MED ORDER — LEVOFLOXACIN 500 MG PO TABS: 500.0000 mg | ORAL_TABLET | Freq: Every day | ORAL | Status: AC

## 2011-06-30 MED ORDER — METFORMIN HCL 500 MG PO TABS: 500.0000 mg | ORAL_TABLET | Freq: Two times a day (BID) | ORAL | Status: DC

## 2011-06-30 MED ORDER — LISINOPRIL 20 MG PO TABS: 20.0000 mg | ORAL_TABLET | Freq: Every day | ORAL | Status: DC

## 2011-06-30 NOTE — Patient Instructions (Signed)

## 2011-06-30 NOTE — Progress Notes (Signed)
  Subjective:    Patient ID: Olivia Robinson, female    DOB: Sep 13, 1934, 77 y.o.   MRN: [id number]  HPI patient presents with a three-month history of a productive cough. She states she was visiting in [place] in January and following that visit she has had a cough. Her cough produces small amounts of a mucus-like material but has not been bloody or yellow. She does have a sore throat head congestion or other symptoms. She also has a history of diabetes has not had followup of this she has a history of thyroid disease has not had followup on this and would like refills on her medication for this.    Review of Systems she denies chest pain or shortness of breath     Objective:   Physical Exam  Constitutional: She appears well-developed and well-nourished.  HENT:  Head: Normocephalic.  Right Ear: External ear normal.  Left Ear: External ear normal.  Eyes: Pupils are equal, round, and reactive to light.  Neck: Neck supple. No JVD present. No tracheal deviation present.  Cardiovascular: Normal rate and regular rhythm.  Exam reveals no gallop and no friction rub.   No murmur heard. Pulmonary/Chest: Effort normal. No stridor.       There are rhonchi present bilaterally. There is prolonged expiratory phase. There are coarse rhonchi present diffusely  Lymphadenopathy:    She has no cervical adenopathy.    [facility] reading (PRIMARY) by  Dr.Brittinee Risk chest x-ray shows a consolidated lingular infiltrate. .       Assessment & Plan:   Assessment  is of productive cough present for the last 2-1/2 months. We'll also check routine labs to followup on her ongoing medical problems of thyroid disease and hypertension. Glucose is 134 hemoglobin A1c is 7.2 diabetes not under current control. We'll increase her Glucophage dosage. Chest x-ray shows a significant lingular infiltrate we'll treat her pneumonia a followup chest x-ray in 10-14 days.

## 2011-07-11 ENCOUNTER — Ambulatory Visit (INDEPENDENT_AMBULATORY_CARE_PROVIDER_SITE_OTHER): Payer: Medicare Other | Admitting: Obstetrics and Gynecology

## 2011-07-11 VITALS — BP 130/80 | Resp 20 | Wt 210.0 lb

## 2011-07-11 DIAGNOSIS — N393 Stress incontinence (female) (male): Secondary | ICD-10-CM

## 2011-07-11 DIAGNOSIS — N8189 Other female genital prolapse: Secondary | ICD-10-CM | POA: Diagnosis not present

## 2011-07-11 NOTE — Progress Notes (Signed)
C/o leaking with pessary in place and no leaking without pessary  Filed Vitals:   07/11/11 1000  BP: 130/80  Resp: 20   Pelvic exam: normal external genitalia, vulva, vagina, adnexa. No erosions or irritation  A/P Pelvic Relaxation and SUI Pessary removed and not replaced per pt request secondary to pessary unmasks SUI Options discussed Pt will prn if decides to proceed with further mgmt incl. Surgical mgmt D/c vaginal creams

## 2011-07-22 DIAGNOSIS — M25559 Pain in unspecified hip: Secondary | ICD-10-CM | POA: Diagnosis not present

## 2011-07-22 DIAGNOSIS — M1710 Unilateral primary osteoarthritis, unspecified knee: Secondary | ICD-10-CM | POA: Diagnosis not present

## 2011-07-29 ENCOUNTER — Ambulatory Visit: Payer: Medicare Other

## 2011-07-29 ENCOUNTER — Ambulatory Visit (INDEPENDENT_AMBULATORY_CARE_PROVIDER_SITE_OTHER): Payer: Medicare Other | Admitting: Family Medicine

## 2011-07-29 ENCOUNTER — Encounter: Payer: Self-pay | Admitting: Family Medicine

## 2011-07-29 VITALS — BP 150/74 | HR 69 | Temp 97.1°F | Resp 18 | Ht 64.0 in | Wt 208.0 lb

## 2011-07-29 DIAGNOSIS — E119 Type 2 diabetes mellitus without complications: Secondary | ICD-10-CM

## 2011-07-29 DIAGNOSIS — J189 Pneumonia, unspecified organism: Secondary | ICD-10-CM | POA: Diagnosis not present

## 2011-07-29 DIAGNOSIS — J302 Other seasonal allergic rhinitis: Secondary | ICD-10-CM

## 2011-07-29 DIAGNOSIS — I10 Essential (primary) hypertension: Secondary | ICD-10-CM

## 2011-07-29 DIAGNOSIS — E039 Hypothyroidism, unspecified: Secondary | ICD-10-CM

## 2011-07-29 DIAGNOSIS — R6889 Other general symptoms and signs: Secondary | ICD-10-CM

## 2011-07-29 DIAGNOSIS — R9389 Abnormal findings on diagnostic imaging of other specified body structures: Secondary | ICD-10-CM

## 2011-07-29 LAB — CBC WITH DIFFERENTIAL/PLATELET
Eosinophils Absolute: 0.3 10*3/uL (ref 0.0–0.7)
Eosinophils Relative: 3 % (ref 0–5)
HCT: 42.8 % (ref 36.0–46.0)
Hemoglobin: 13.8 g/dL (ref 12.0–15.0)
Lymphs Abs: 2.2 10*3/uL (ref 0.7–4.0)
MCH: 29.7 pg (ref 26.0–34.0)
MCV: 92.2 fL (ref 78.0–100.0)
Monocytes Absolute: 0.5 10*3/uL (ref 0.1–1.0)
Monocytes Relative: 6 % (ref 3–12)
RBC: 4.64 MIL/uL (ref 3.87–5.11)

## 2011-07-29 MED ORDER — MOMETASONE FURO-FORMOTEROL FUM 200-5 MCG/ACT IN AERO: 1.0000 | INHALATION_SPRAY | Freq: Two times a day (BID) | RESPIRATORY_TRACT | Status: DC

## 2011-07-29 NOTE — Progress Notes (Signed)
  Subjective:    Patient ID: Olivia Robinson, female    DOB: Feb 03, 1935, 77 y.o.   MRN: [id number]  HPI  Patient developed pneumonia 1 month ago. Continues to cough and wheeze on occasion States feels well otherwise.  Denies SOB/DOE Denies night sweats or unintentional weight loss    Patient had  (L) THR 8/12  (L) knee injection 3 weeks ago secondary to DJD   Review of Systems     Objective:   Physical Exam  Constitutional: She appears well-developed.  HENT:  Nose: Nose normal.  Neck: Normal range of motion. Neck supple.  Cardiovascular: Normal rate, regular rhythm and normal heart sounds.   Pulmonary/Chest: Effort normal.       Decreased BS LLL Coarse BS RLL  Abdominal: Soft. Bowel sounds are normal.  Lymphadenopathy:    She has no cervical adenopathy.  Neurological: She is alert.  Skin: Skin is warm.     [facility] reading (PRIMARY) by  Dr. Hal Hope.  Hazy opacification of  LLL; please compare to prior X Ray     Results for orders placed in visit on 07/29/11  CBC WITH DIFFERENTIAL      Component Value Range   WBC 9.1  4.0 - 10.5 (K/uL)   RBC 4.64  3.87 - 5.11 (MIL/uL)   Hemoglobin 13.8  12.0 - 15.0 (g/dL)   HCT 56.4  33.2 - 95.1 (%)   MCV 92.2  78.0 - 100.0 (fL)   MCH 29.7  26.0 - 34.0 (pg)   MCHC 32.2  30.0 - 36.0 (g/dL)   RDW 88.4  16.6 - 06.3 (%)   Platelets 307  150 - 400 (K/uL)   Neutrophils Relative 67  43 - 77 (%)   Neutro Abs 6.1  1.7 - 7.7 (K/uL)   Lymphocytes Relative 24  12 - 46 (%)   Lymphs Abs 2.2  0.7 - 4.0 (K/uL)   Monocytes Relative 6  3 - 12 (%)   Monocytes Absolute 0.5  0.1 - 1.0 (K/uL)   Eosinophils Relative 3  0 - 5 (%)   Eosinophils Absolute 0.3  0.0 - 0.7 (K/uL)   Basophils Relative 0  0 - 1 (%)   Basophils Absolute 0.0  0.0 - 0.1 (K/uL)   Smear Review Criteria for review not met      Assessment & Plan:   1. Pneumonia  DG Chest 2 View, CBC with Differential, Mometasone Furo-Formoterol Fum 200-5 MCG/ACT AERO  2. Abnormal CXR      3. DM type 2 (diabetes mellitus, type 2)    4. Hypothyroid    5. Gout    6. HTN (hypertension)    7. Seasonal allergies      Patient most likely will need Chest CT to further evaluate opacity in her LLL

## 2011-08-14 ENCOUNTER — Telehealth: Payer: Self-pay | Admitting: Family Medicine

## 2011-08-14 NOTE — Telephone Encounter (Signed)
LM regarding CXR and need for CT of chest.  Asked patient to return call.

## 2011-08-15 ENCOUNTER — Other Ambulatory Visit: Payer: Self-pay | Admitting: Family Medicine

## 2011-08-15 DIAGNOSIS — R9389 Abnormal findings on diagnostic imaging of other specified body structures: Secondary | ICD-10-CM

## 2011-08-18 ENCOUNTER — Telehealth: Payer: Self-pay | Admitting: Family Medicine

## 2011-08-18 NOTE — Telephone Encounter (Signed)
Reviewed CXR findings and need for follow up Chest CT. Patient in agreement with plan. Referral placed.

## 2011-08-20 ENCOUNTER — Ambulatory Visit
Admission: RE | Admit: 2011-08-20 | Discharge: 2011-08-20 | Disposition: A | Discharge status: Home / Self Care | Payer: Medicare Other | Source: Ambulatory Visit | Attending: Family Medicine | Admitting: Family Medicine

## 2011-08-20 DIAGNOSIS — K7689 Other specified diseases of liver: Secondary | ICD-10-CM | POA: Diagnosis not present

## 2011-08-20 DIAGNOSIS — R05 Cough: Secondary | ICD-10-CM | POA: Diagnosis not present

## 2011-08-20 DIAGNOSIS — R9389 Abnormal findings on diagnostic imaging of other specified body structures: Secondary | ICD-10-CM

## 2011-08-20 DIAGNOSIS — J984 Other disorders of lung: Secondary | ICD-10-CM | POA: Diagnosis not present

## 2011-08-20 MED ORDER — IOHEXOL 300 MG/ML  SOLN
75.0000 mL | Freq: Once | INTRAMUSCULAR | Status: AC | PRN
  Administered 2011-08-20: 75 mL via INTRAVENOUS

## 2011-08-24 ENCOUNTER — Other Ambulatory Visit: Payer: Self-pay | Admitting: Family Medicine

## 2011-08-24 ENCOUNTER — Telehealth: Payer: Self-pay | Admitting: Family Medicine

## 2011-08-24 DIAGNOSIS — I2510 Atherosclerotic heart disease of native coronary artery without angina pectoris: Secondary | ICD-10-CM

## 2011-08-24 NOTE — Telephone Encounter (Signed)
LM asking for return call.

## 2011-09-16 DIAGNOSIS — S058X9A Other injuries of unspecified eye and orbit, initial encounter: Secondary | ICD-10-CM | POA: Diagnosis not present

## 2011-09-16 DIAGNOSIS — H01009 Unspecified blepharitis unspecified eye, unspecified eyelid: Secondary | ICD-10-CM | POA: Diagnosis not present

## 2011-09-16 DIAGNOSIS — H1045 Other chronic allergic conjunctivitis: Secondary | ICD-10-CM | POA: Diagnosis not present

## 2011-09-16 DIAGNOSIS — H1010 Acute atopic conjunctivitis, unspecified eye: Secondary | ICD-10-CM | POA: Diagnosis not present

## 2011-10-14 DIAGNOSIS — E119 Type 2 diabetes mellitus without complications: Secondary | ICD-10-CM | POA: Diagnosis not present

## 2011-10-14 DIAGNOSIS — I10 Essential (primary) hypertension: Secondary | ICD-10-CM | POA: Diagnosis not present

## 2011-10-14 DIAGNOSIS — M169 Osteoarthritis of hip, unspecified: Secondary | ICD-10-CM | POA: Diagnosis not present

## 2011-10-14 DIAGNOSIS — M25559 Pain in unspecified hip: Secondary | ICD-10-CM | POA: Diagnosis not present

## 2011-10-14 DIAGNOSIS — I2510 Atherosclerotic heart disease of native coronary artery without angina pectoris: Secondary | ICD-10-CM | POA: Diagnosis not present

## 2011-10-14 DIAGNOSIS — R0989 Other specified symptoms and signs involving the circulatory and respiratory systems: Secondary | ICD-10-CM | POA: Diagnosis not present

## 2011-10-14 DIAGNOSIS — M1710 Unilateral primary osteoarthritis, unspecified knee: Secondary | ICD-10-CM | POA: Diagnosis not present

## 2011-10-14 DIAGNOSIS — E782 Mixed hyperlipidemia: Secondary | ICD-10-CM | POA: Diagnosis not present

## 2011-10-24 DIAGNOSIS — R0602 Shortness of breath: Secondary | ICD-10-CM | POA: Diagnosis not present

## 2011-10-24 DIAGNOSIS — I2510 Atherosclerotic heart disease of native coronary artery without angina pectoris: Secondary | ICD-10-CM | POA: Diagnosis not present

## 2011-10-24 DIAGNOSIS — R9431 Abnormal electrocardiogram [ECG] [EKG]: Secondary | ICD-10-CM | POA: Diagnosis not present

## 2011-11-01 DIAGNOSIS — I2510 Atherosclerotic heart disease of native coronary artery without angina pectoris: Secondary | ICD-10-CM | POA: Diagnosis not present

## 2011-11-01 DIAGNOSIS — R9431 Abnormal electrocardiogram [ECG] [EKG]: Secondary | ICD-10-CM | POA: Diagnosis not present

## 2011-11-01 DIAGNOSIS — R0602 Shortness of breath: Secondary | ICD-10-CM | POA: Diagnosis not present

## 2011-11-08 ENCOUNTER — Ambulatory Visit (INDEPENDENT_AMBULATORY_CARE_PROVIDER_SITE_OTHER): Payer: Medicare Other | Admitting: Emergency Medicine

## 2011-11-08 VITALS — BP 116/62 | HR 73 | Temp 97.4°F | Resp 18 | Ht 64.5 in | Wt 211.0 lb

## 2011-11-08 DIAGNOSIS — R300 Dysuria: Secondary | ICD-10-CM | POA: Diagnosis not present

## 2011-11-08 DIAGNOSIS — Z13220 Encounter for screening for lipoid disorders: Secondary | ICD-10-CM

## 2011-11-08 DIAGNOSIS — E785 Hyperlipidemia, unspecified: Secondary | ICD-10-CM

## 2011-11-08 LAB — POCT URINALYSIS DIPSTICK
Bilirubin, UA: NEGATIVE
Blood, UA: NEGATIVE
Nitrite, UA: NEGATIVE
pH, UA: 7

## 2011-11-08 LAB — LIPID PANEL
HDL: 39 mg/dL — ABNORMAL LOW (ref >39)
LDL Cholesterol: 144 mg/dL — ABNORMAL HIGH (ref 0–99)

## 2011-11-08 LAB — POCT UA - MICROSCOPIC ONLY
Casts, Ur, LPF, POC: NEGATIVE
Crystals, Ur, HPF, POC: NEGATIVE
Mucus, UA: NEGATIVE
Yeast, UA: NEGATIVE

## 2011-11-08 NOTE — Progress Notes (Deleted)
  Subjective:    Patient ID: Olivia Robinson, female    DOB: 1934-08-18, 77 y.o.   MRN: [id number]  HPI    Review of Systems     Objective:   Physical Exam        Assessment & Plan:

## 2011-11-08 NOTE — Patient Instructions (Addendum)
Drink large amounts of water and cranberry juice. Take Pyridium 3 times a day for the burning sensation. I will call you with results of your urine culture. Fax results of your lipid panel to Dr. Jacinto Halim.

## 2011-11-08 NOTE — Progress Notes (Signed)
  Subjective:    Patient ID: Olivia Robinson, female    DOB: [birth year], 77 y.o.   MRN: [id number]  HPI patient had a burning and discomfort with urination. She has been to see Dr. Jacinto Halim . He wants her to have a lipid panel done.     Review of Systems     Objective:   Physical Exam chest is clear cardiac exam is unremarkable the abdomen is soft nontender.  Results for orders placed in visit on 11/08/11  POCT UA - MICROSCOPIC ONLY      Component Value Range   WBC, Ur, HPF, POC 0-2     RBC, urine, microscopic 0-1     Bacteria, U Microscopic trace     Mucus, UA neg     Epithelial cells, urine per micros 0-2     Crystals, Ur, HPF, POC neg     Casts, Ur, LPF, POC neg     Yeast, UA neg    POCT URINALYSIS DIPSTICK      Component Value Range   Color, UA light yellow     Clarity, UA clear     Glucose, UA neg     Bilirubin, UA neg     Ketones, UA neg     Spec Grav, UA 1.010     Blood, UA neg     pH, UA 7.0     Protein, UA neg     Urobilinogen, UA 0.2     Nitrite, UA neg     Leukocytes, UA Negative          Assessment & Plan:  Her urine is normal.Will treat with pyridium pending culture

## 2011-11-11 LAB — URINE CULTURE: Colony Count: >=100000

## 2011-11-15 DIAGNOSIS — I10 Essential (primary) hypertension: Secondary | ICD-10-CM | POA: Diagnosis not present

## 2011-11-15 DIAGNOSIS — R0989 Other specified symptoms and signs involving the circulatory and respiratory systems: Secondary | ICD-10-CM | POA: Diagnosis not present

## 2011-11-15 DIAGNOSIS — E119 Type 2 diabetes mellitus without complications: Secondary | ICD-10-CM | POA: Diagnosis not present

## 2011-11-15 DIAGNOSIS — I2510 Atherosclerotic heart disease of native coronary artery without angina pectoris: Secondary | ICD-10-CM | POA: Diagnosis not present

## 2011-11-19 ENCOUNTER — Encounter: Payer: Self-pay | Admitting: Physician Assistant

## 2011-11-19 DIAGNOSIS — R0602 Shortness of breath: Secondary | ICD-10-CM

## 2011-11-19 DIAGNOSIS — R9431 Abnormal electrocardiogram [ECG] [EKG]: Secondary | ICD-10-CM | POA: Insufficient documentation

## 2011-11-27 ENCOUNTER — Encounter: Payer: Self-pay | Admitting: Physician Assistant

## 2011-11-27 DIAGNOSIS — E119 Type 2 diabetes mellitus without complications: Secondary | ICD-10-CM | POA: Insufficient documentation

## 2011-11-27 DIAGNOSIS — I2510 Atherosclerotic heart disease of native coronary artery without angina pectoris: Secondary | ICD-10-CM | POA: Insufficient documentation

## 2011-11-30 ENCOUNTER — Other Ambulatory Visit: Payer: Self-pay | Admitting: Physician Assistant

## 2011-11-30 DIAGNOSIS — R300 Dysuria: Secondary | ICD-10-CM

## 2011-11-30 LAB — POCT UA - MICROSCOPIC ONLY
Casts, Ur, LPF, POC: NEGATIVE
Epithelial cells, urine per micros: NEGATIVE
Mucus, UA: NEGATIVE
Yeast, UA: NEGATIVE

## 2011-11-30 LAB — POCT URINALYSIS DIPSTICK
Blood, UA: NEGATIVE
Glucose, UA: NEGATIVE
Nitrite, UA: NEGATIVE
Protein, UA: NEGATIVE
Urobilinogen, UA: 0.2
pH, UA: 6.5

## 2011-12-09 ENCOUNTER — Encounter: Payer: Self-pay | Admitting: *Deleted

## 2011-12-09 DIAGNOSIS — I2510 Atherosclerotic heart disease of native coronary artery without angina pectoris: Secondary | ICD-10-CM

## 2012-01-14 DIAGNOSIS — Z23 Encounter for immunization: Secondary | ICD-10-CM | POA: Diagnosis not present

## 2012-02-12 ENCOUNTER — Encounter: Payer: Self-pay | Admitting: Physician Assistant

## 2012-02-24 DIAGNOSIS — R030 Elevated blood-pressure reading, without diagnosis of hypertension: Secondary | ICD-10-CM | POA: Diagnosis not present

## 2012-02-24 DIAGNOSIS — R05 Cough: Secondary | ICD-10-CM | POA: Diagnosis not present

## 2012-02-26 ENCOUNTER — Other Ambulatory Visit: Payer: Self-pay | Admitting: Emergency Medicine

## 2012-02-26 ENCOUNTER — Ambulatory Visit (INDEPENDENT_AMBULATORY_CARE_PROVIDER_SITE_OTHER): Payer: Medicare Other | Admitting: Emergency Medicine

## 2012-02-26 VITALS — BP 118/69 | HR 78 | Temp 97.4°F | Resp 18 | Ht 64.0 in | Wt 210.0 lb

## 2012-02-26 DIAGNOSIS — J309 Allergic rhinitis, unspecified: Secondary | ICD-10-CM

## 2012-02-26 DIAGNOSIS — J45909 Unspecified asthma, uncomplicated: Secondary | ICD-10-CM

## 2012-02-26 DIAGNOSIS — R05 Cough: Secondary | ICD-10-CM | POA: Diagnosis not present

## 2012-02-26 DIAGNOSIS — E119 Type 2 diabetes mellitus without complications: Secondary | ICD-10-CM | POA: Diagnosis not present

## 2012-02-26 DIAGNOSIS — J069 Acute upper respiratory infection, unspecified: Secondary | ICD-10-CM

## 2012-02-26 DIAGNOSIS — R918 Other nonspecific abnormal finding of lung field: Secondary | ICD-10-CM

## 2012-02-26 LAB — GLUCOSE, POCT (MANUAL RESULT ENTRY): POC Glucose: 145 mg/dl — AB (ref 70–99)

## 2012-02-26 LAB — POCT CBC
Granulocyte percent: 68.3 %G (ref 37–80)
HCT, POC: 44.2 % (ref 37.7–47.9)
Hemoglobin: 13.4 g/dL (ref 12.2–16.2)
POC Granulocyte: 5.6 (ref 2–6.9)

## 2012-02-26 MED ORDER — ALBUTEROL SULFATE HFA 108 (90 BASE) MCG/ACT IN AERS: 2.0000 | INHALATION_SPRAY | RESPIRATORY_TRACT | Status: DC | PRN

## 2012-02-26 MED ORDER — FLUTICASONE PROPIONATE 50 MCG/ACT NA SUSP: 2.0000 | Freq: Every day | NASAL | Status: DC

## 2012-02-26 MED ORDER — ALBUTEROL SULFATE (2.5 MG/3ML) 0.083% IN NEBU
2.5000 mg | INHALATION_SOLUTION | Freq: Once | RESPIRATORY_TRACT | Status: AC
  Administered 2012-02-26: 2.5 mg via RESPIRATORY_TRACT

## 2012-02-26 NOTE — Progress Notes (Signed)
  Subjective:    Patient ID: Olivia Robinson, female    DOB: Mar 13, 1935, 77 y.o.   MRN: [id number]  HPI Patient comes into our office today complaining of wheezing, sore throat, sob, headache, cough  and chest congestion Little yellowish mucus mostly clear This started a week ago while she was blowing leaves Former smoker    Review of Systems  Constitutional: Negative for fever and fatigue.  HENT: Positive for congestion and postnasal drip.   Respiratory: Positive for cough, shortness of breath and wheezing.   Neurological: Positive for headaches.       Objective:   Physical Exam HEENT exam reveals clear rhinorrhea the TMs are clear throat is normal chest exam reveals increased AP diameter with rhonchi bilaterally with prolonged expiration.  Results for orders placed in visit on 02/26/12  POCT CBC      Component Value Range   WBC 8.2  4.6 - 10.2 K/uL   Lymph, poc 2.0  0.6 - 3.4   POC LYMPH PERCENT 24.8  10 - 50 %L   MID (cbc) 0.6  0 - 0.9   POC MID % 6.9  0 - 12 %M   POC Granulocyte 5.6  2 - 6.9   Granulocyte percent 68.3  37 - 80 %G   RBC 4.61  4.04 - 5.48 M/uL   Hemoglobin 13.4  12.2 - 16.2 g/dL   HCT, POC 30.8  65.7 - 47.9 %   MCV 95.8  80 - 97 fL   MCH, POC 29.1  27 - 31.2 pg   MCHC 30.3 (*) 31.8 - 35.4 g/dL   RDW, POC 84.6     Platelet Count, POC 340  142 - 424 K/uL   MPV 8.4  0 - 99.8 fL  GLUCOSE, POCT (MANUAL RESULT ENTRY)      Component Value Range   POC Glucose 145 (*) 70 - 99 mg/dl        Assessment & Plan:  Sugar is acceptable at 145 with a history of diabetes. She did improve with a nebulizer treatment. She was given a prescription for albuterol HFA 2 puffs every 4-6 hours along with Flonase for her allergic rhinitis. She has dulera inhaler at home but has not been using it. she is to do 2 puffs twice a day of this inhaler

## 2012-03-09 ENCOUNTER — Ambulatory Visit
Admission: RE | Admit: 2012-03-09 | Discharge: 2012-03-09 | Disposition: A | Discharge status: Home / Self Care | Payer: Medicare Other | Source: Ambulatory Visit | Attending: Emergency Medicine | Admitting: Emergency Medicine

## 2012-03-09 DIAGNOSIS — J479 Bronchiectasis, uncomplicated: Secondary | ICD-10-CM | POA: Diagnosis not present

## 2012-03-09 DIAGNOSIS — R918 Other nonspecific abnormal finding of lung field: Secondary | ICD-10-CM

## 2012-03-09 DIAGNOSIS — J984 Other disorders of lung: Secondary | ICD-10-CM | POA: Diagnosis not present

## 2012-03-14 ENCOUNTER — Other Ambulatory Visit: Payer: Self-pay | Admitting: Radiology

## 2012-03-14 DIAGNOSIS — R9389 Abnormal findings on diagnostic imaging of other specified body structures: Secondary | ICD-10-CM

## 2012-03-17 ENCOUNTER — Institutional Professional Consult (permissible substitution): Payer: Medicare Other | Admitting: Pulmonary Disease

## 2012-03-20 ENCOUNTER — Ambulatory Visit (INDEPENDENT_AMBULATORY_CARE_PROVIDER_SITE_OTHER): Payer: Medicare Other | Admitting: Internal Medicine

## 2012-03-20 ENCOUNTER — Encounter: Payer: Self-pay | Admitting: Internal Medicine

## 2012-03-20 VITALS — BP 104/60 | HR 84 | Temp 98.0°F | Ht 65.0 in | Wt 211.2 lb

## 2012-03-20 DIAGNOSIS — R9389 Abnormal findings on diagnostic imaging of other specified body structures: Secondary | ICD-10-CM

## 2012-03-20 DIAGNOSIS — R05 Cough: Secondary | ICD-10-CM | POA: Diagnosis not present

## 2012-03-20 DIAGNOSIS — I10 Essential (primary) hypertension: Secondary | ICD-10-CM

## 2012-03-20 MED ORDER — PREDNISONE (PAK) 10 MG PO TABS: ORAL_TABLET | ORAL | Status: DC

## 2012-03-20 MED ORDER — OLMESARTAN MEDOXOMIL 20 MG PO TABS: 20.0000 mg | ORAL_TABLET | Freq: Every day | ORAL | Status: DC

## 2012-03-20 NOTE — Progress Notes (Signed)
  Subjective:    Patient ID: Olivia Robinson, female    DOB: 01-15-1935 MRN: [id number]  HPI  69 yowf quit smoking 1998 with cough that resolved referred 03/20/2012 to pulmonary clinic by Dr Cleta Alberts.   03/20/2012 1st pulmonary eval/ [facility]na[facility] cc recurrent cough initial onset abrupt with ? [place] Peers Jan 2013 completely better after abx/pred then started back in November 2013,  More day than night but worse if lie down, better in recliner, productive of clear mucus.  No sob unless coughing. No obvious daytime variabilty or assoc   cp or chest tightness, subjective wheeze overt sinus or hb symptoms. No unusual exp hx or h/o childhood pna/ asthma or premature birth to her knowledge. No better with inhalers   Sleeping ok (unless flat) without nocturnal  or early am exacerbation  of respiratory  c/o's or need for noct saba. Also denies any obvious fluctuation of symptoms with weather or environmental changes or other aggravating or alleviating factors except as outlined above      Review of Systems  Constitutional: Negative for fever, chills and unexpected weight change.  HENT: Positive for congestion, sneezing and dental problem. Negative for ear pain, nosebleeds, sore throat, rhinorrhea, trouble swallowing, voice change, postnasal drip and sinus pressure.   Eyes: Negative for visual disturbance.  Respiratory: Positive for cough and shortness of breath. Negative for choking.   Cardiovascular: Negative for chest pain and leg swelling.  Gastrointestinal: Negative for vomiting, abdominal pain and diarrhea.  Genitourinary: Negative for difficulty urinating.  Musculoskeletal: Positive for arthralgias.  Skin: Positive for rash.  Neurological: Negative for tremors, syncope and headaches.  Hematological: Does not bruise/bleed easily.       Objective:   Physical Exam amb wf nad with very congested cough Wt Readings from Last 3 Encounters:  03/20/12 211 lb 3.2 oz (95.8 kg)  02/26/12 210 lb (95.255 kg)   11/08/11 211 lb (95.709 kg)     HEENT: nl dentition, turbinates, and orophanx. Nl external ear canals without cough reflex   NECK :  without JVD/Nodes/TM/ nl carotid upstrokes bilaterally   LUNGS: no acc muscle use, clear to A and P bilaterally without cough on insp or exp maneuvers   CV:  RRR  no s3 or murmur or increase in P2, no edema   ABD:  soft and nontender with nl excursion in the supine position. No bruits or organomegaly, bowel sounds nl  MS:  warm without deformities, calf tenderness, cyanosis or clubbing  SKIN: warm and dry without lesions    NEURO:  alert, approp, no deficits      CT 03/09/12 1. Stable irregular nodular lesion in the right upper lobe.  Recommend continued follow-up with CT chest scan in 6 months.  2. Linear scarring and minimal bronchiectatic change in the left  lower lobe. Has the patient had pneumonia in the intervening  months?  3. Probable fatty infiltration of the liver      Assessment & Plan:

## 2012-03-20 NOTE — Patient Instructions (Addendum)
Prednisone 10 mg take  4 each am x 2 days,   2 each am x 2 days,  1 each am x2days and stop   Try prilosec 20mg   Take 30-60 min before first meal of the day and Pepcid 20 mg one bedtime until cough is completely gone for at least a week without the need for cough suppression  I think of reflux for chronic cough like I do oxygen for fire (doesn't cause the fire but once you get the oxygen suppressed it usually goes away regardless of the exact cause).   Stop lisinopril ( like gasoline) and start benicar 20 mg one half daily   GERD (REFLUX)  is an extremely common cause of respiratory symptoms, many times with no significant heartburn at all.    It can be treated with medication, but also with lifestyle changes including avoidance of late meals, excessive alcohol, smoking cessation, and avoid fatty foods, chocolate, peppermint, colas, red wine, and acidic juices such as orange juice.  NO MINT OR MENTHOL PRODUCTS SO NO COUGH DROPS  USE SUGARLESS CANDY INSTEAD (jolley ranchers or Stover's)  NO OIL BASED VITAMINS - use powdered substitutes.  If not 100% better return after holidays otherwise Dr Cleta Alberts can do your followup.

## 2012-03-21 DIAGNOSIS — R9389 Abnormal findings on diagnostic imaging of other specified body structures: Secondary | ICD-10-CM | POA: Insufficient documentation

## 2012-03-21 NOTE — Assessment & Plan Note (Signed)

## 2012-03-21 NOTE — Assessment & Plan Note (Signed)
The most common causes of chronic cough in immunocompetent adults include the following: upper airway cough syndrome (UACS), previously referred to as postnasal drip syndrome (PNDS), which is caused by variety of rhinosinus conditions; (2) asthma; (3) GERD; (4) chronic bronchitis from cigarette smoking or other inhaled environmental irritants; (5) nonasthmatic eosinophilic bronchitis; and (6) bronchiectasis.   These conditions, singly or in combination, have accounted for up to 94% of the causes of chronic cough in prospective studies.   Other conditions have constituted no >6% of the causes in prospective studies These have included bronchogenic carcinoma, chronic interstitial pneumonia, sarcoidosis, left ventricular failure, ACEI-induced cough, and aspiration from a condition associated with pharyngeal dysfunction.    Chronic cough is often simultaneously caused by more than one condition. A single cause has been found from 38 to 82% of the time, multiple causes from 18 to 62%. Multiply caused cough has been the result of three diseases up to 42% of the time.       This is almost certainly  Classic Upper airway cough syndrome, so named because it's frequently impossible to sort out how much is  CR/sinusitis with freq throat clearing (which can be related to primary GERD)   vs  causing  secondary (" extra esophageal")  GERD from wide swings in gastric pressure that occur with throat clearing, often  promoting self use of mint and menthol lozenges that reduce the lower esophageal sphincter tone and exacerbate the problem further in a cyclical fashion.   These are the same pts (now being labeled as having "irritable larynx syndrome" by some cough centers) who not infrequently have a history of having failed to tolerate ace inhibitors,  dry powder inhalers or biphosphonates or report having atypical reflux symptoms that don't respond to standard doses of PPI , and are easily confused as having aecopd or  asthma flares by even experienced allergists/ pulmonologists.   For now try off acei and on max gerd rx then regroup after the holidays if not improved to her satisfaction

## 2012-03-21 NOTE — Assessment & Plan Note (Signed)
CT 03/09/12 1. Stable irregular nodular lesion in the right upper lobe.  Recommend continued follow-up with CT chest scan in 6 months.  2. Linear scarring and minimal bronchiectatic change in the left  lower lobe. Has the patient had pneumonia in the intervening  months?   This looks benign to me and too small for PET to help > clearly not related to cough.  rec follow radiology recs re f/u ct in 6 months - placed in our tickle file

## 2012-06-02 ENCOUNTER — Other Ambulatory Visit: Payer: Self-pay | Admitting: Family Medicine

## 2012-07-19 ENCOUNTER — Other Ambulatory Visit: Payer: Self-pay | Admitting: Emergency Medicine

## 2012-07-20 ENCOUNTER — Ambulatory Visit (INDEPENDENT_AMBULATORY_CARE_PROVIDER_SITE_OTHER): Payer: Medicare Other | Admitting: Family Medicine

## 2012-07-20 VITALS — BP 130/70 | HR 58 | Temp 97.8°F | Resp 18 | Ht 65.0 in | Wt 208.0 lb

## 2012-07-20 DIAGNOSIS — I10 Essential (primary) hypertension: Secondary | ICD-10-CM | POA: Diagnosis not present

## 2012-07-20 DIAGNOSIS — R7309 Other abnormal glucose: Secondary | ICD-10-CM | POA: Diagnosis not present

## 2012-07-20 DIAGNOSIS — R7303 Prediabetes: Secondary | ICD-10-CM

## 2012-07-20 DIAGNOSIS — E039 Hypothyroidism, unspecified: Secondary | ICD-10-CM | POA: Diagnosis not present

## 2012-07-20 DIAGNOSIS — M109 Gout, unspecified: Secondary | ICD-10-CM | POA: Diagnosis not present

## 2012-07-20 LAB — COMPREHENSIVE METABOLIC PANEL
ALT: 39 U/L — ABNORMAL HIGH (ref 0–35)
AST: 25 U/L (ref 0–37)
Albumin: 4.1 g/dL (ref 3.5–5.2)
Alkaline Phosphatase: 82 U/L (ref 39–117)
BUN: 19 mg/dL (ref 6–23)
CO2: 26 mEq/L (ref 19–32)
Calcium: 9.8 mg/dL (ref 8.4–10.5)
Chloride: 104 mEq/L (ref 96–112)
Creat: 0.77 mg/dL (ref 0.50–1.10)
Glucose, Bld: 141 mg/dL — ABNORMAL HIGH (ref 70–99)
Potassium: 4.5 mEq/L (ref 3.5–5.3)
Sodium: 140 mEq/L (ref 135–145)
Total Bilirubin: 0.6 mg/dL (ref 0.3–1.2)
Total Protein: 7 g/dL (ref 6.0–8.3)

## 2012-07-20 LAB — POCT CBC
Granulocyte percent: 62.5 %G (ref 37–80)
HCT, POC: 39.3 % (ref 37.7–47.9)
Hemoglobin: 12.2 g/dL (ref 12.2–16.2)
Lymph, poc: 1.9 (ref 0.6–3.4)
MCH, POC: 29.4 pg (ref 27–31.2)
MCHC: 31 g/dL — AB (ref 31.8–35.4)
MCV: 94.7 fL (ref 80–97)
MID (cbc): 0.6 (ref 0–0.9)
MPV: 9.4 fL (ref 0–99.8)
POC Granulocyte: 4.3 (ref 2–6.9)
POC LYMPH PERCENT: 28.6 %L (ref 10–50)
POC MID %: 8.9 %M (ref 0–12)
Platelet Count, POC: 252 10*3/uL (ref 142–424)
RBC: 4.15 M/uL (ref 4.04–5.48)
RDW, POC: 14.2 %
WBC: 6.8 10*3/uL (ref 4.6–10.2)

## 2012-07-20 LAB — LIPID PANEL
Cholesterol: 273 mg/dL — ABNORMAL HIGH (ref 0–200)
HDL: 37 mg/dL — ABNORMAL LOW (ref >39)
Total CHOL/HDL Ratio: 7.4 Ratio
Triglycerides: 494 mg/dL — ABNORMAL HIGH (ref <150)

## 2012-07-20 LAB — URIC ACID: Uric Acid, Serum: 8.2 mg/dL — ABNORMAL HIGH (ref 2.4–7.0)

## 2012-07-20 LAB — POCT GLYCOSYLATED HEMOGLOBIN (HGB A1C): Hemoglobin A1C: 7.9

## 2012-07-20 LAB — TSH: TSH: 1.322 u[IU]/mL (ref 0.350–4.500)

## 2012-07-20 MED ORDER — LEVOTHYROXINE SODIUM 125 MCG PO TABS: 125.0000 ug | ORAL_TABLET | Freq: Every day | ORAL | Status: DC

## 2012-07-20 MED ORDER — LISINOPRIL 20 MG PO TABS: 20.0000 mg | ORAL_TABLET | Freq: Every day | ORAL | Status: DC

## 2012-07-20 MED ORDER — ALLOPURINOL 300 MG PO TABS: 300.0000 mg | ORAL_TABLET | Freq: Every day | ORAL | Status: DC

## 2012-07-20 MED ORDER — METFORMIN HCL 500 MG PO TABS: 500.0000 mg | ORAL_TABLET | Freq: Two times a day (BID) | ORAL | Status: DC

## 2012-07-20 NOTE — Progress Notes (Signed)
  Subjective:    Patient ID: Olivia Robinson, female    DOB: [birth year], 78 y.o.   MRN: [id number]  HPI 78 yo female seen here for lab draw and medication refill. Report that she feels great. Has not taken allopurinol in months and has not had a gout attack. Reports that she was initially taken off of Lisinopril for cough, but has been taking it and has not had any cough. States that she has regular eye exams. Denies any cracks or ulcers on her feet.    Review of Systems  Constitutional: Negative for fever, chills and unexpected weight change.  Respiratory: Negative for cough and wheezing.   Cardiovascular: Negative.        Objective:   Physical Exam  Constitutional: She is oriented to person, place, and time. She appears well-developed and well-nourished.  HENT:  Head: Normocephalic and atraumatic.  Neck: Normal range of motion. Neck supple.  Cardiovascular: Normal rate, regular rhythm and normal heart sounds.   Pulmonary/Chest: Effort normal and breath sounds normal.  Abdominal: Soft. Bowel sounds are normal.  Neurological: She is alert and oriented to person, place, and time.  Skin: Skin is warm and dry.  Diabetic foot exam: No ulcers and cracks noted. Sensation in tact.   Results for orders placed in visit on 07/20/12  POCT CBC      Result Value Range   WBC 6.8  4.6 - 10.2 K/uL   Lymph, poc 1.9  0.6 - 3.4   POC LYMPH PERCENT 28.6  10 - 50 %L   MID (cbc) 0.6  0 - 0.9   POC MID % 8.9  0 - 12 %M   POC Granulocyte 4.3  2 - 6.9   Granulocyte percent 62.5  37 - 80 %G   RBC 4.15  4.04 - 5.48 M/uL   Hemoglobin 12.2  12.2 - 16.2 g/dL   HCT, POC 40.9  81.1 - 47.9 %   MCV 94.7  80 - 97 fL   MCH, POC 29.4  27 - 31.2 pg   MCHC 31.0 (*) 31.8 - 35.4 g/dL   RDW, POC 91.4     Platelet Count, POC 252  142 - 424 K/uL   MPV 9.4  0 - 99.8 fL  POCT GLYCOSYLATED HEMOGLOBIN (HGB A1C)      Result Value Range   Hemoglobin A1C 7.9           Assessment & Plan:  78 yo female seen today  for medication refills. Hypertension, hypothyroidism, gout. Restarted allopurinol, as she previously had 1-2 gout flares per month. Refilled Lisinopril, levothyroxine, allopurinol, metformin for 1 year. Checked CBC, A1c, uric acid, CMET, and TSH.

## 2012-07-21 ENCOUNTER — Other Ambulatory Visit: Payer: Self-pay | Admitting: Family Medicine

## 2012-07-21 DIAGNOSIS — E785 Hyperlipidemia, unspecified: Secondary | ICD-10-CM

## 2012-07-21 MED ORDER — ATORVASTATIN CALCIUM 10 MG PO TABS: 10.0000 mg | ORAL_TABLET | Freq: Every day | ORAL | Status: DC

## 2012-07-30 ENCOUNTER — Telehealth: Payer: Self-pay | Admitting: *Deleted

## 2012-07-30 NOTE — Telephone Encounter (Signed)
LMTCB for the pt 

## 2012-07-30 NOTE — Telephone Encounter (Signed)
Message copied by Christen Butter on Thu Jul 30, 2012  4:48 PM ------      Message from: Nyoka Cowden      Created: Sat Mar 21, 2012  8:32 AM       Make sure she's had her ct by now ------

## 2012-07-31 NOTE — Telephone Encounter (Signed)
LMTCB

## 2012-08-04 NOTE — Telephone Encounter (Signed)
LMTCB

## 2012-08-05 ENCOUNTER — Encounter: Payer: Self-pay | Admitting: Internal Medicine

## 2012-08-05 NOTE — Telephone Encounter (Signed)
lmomtcb x1 for pt 

## 2012-08-05 NOTE — Telephone Encounter (Signed)
Pt called and then hung up. I called pt back. Pt states she feels fine now and does not want to have this done. Please advise MW thanks

## 2012-08-05 NOTE — Telephone Encounter (Signed)
Ok  But we need to let Dr Olivia Robinson know she declined f/u here.

## 2012-08-12 NOTE — Telephone Encounter (Signed)
Will fax this note to Dr Cleta Alberts

## 2012-08-13 ENCOUNTER — Telehealth: Payer: Self-pay | Admitting: Emergency Medicine

## 2012-08-13 DIAGNOSIS — R911 Solitary pulmonary nodule: Secondary | ICD-10-CM

## 2012-08-13 NOTE — Telephone Encounter (Signed)
Called her, advised Ct scan due, she is to have this. Order placed. She will call back and make follow up with you after the appt for CT is made. FYI to you. Olivia Robinson

## 2012-08-13 NOTE — Telephone Encounter (Signed)
Call patient and advise her to have followup as instructed by Dr. Sherene Sires.

## 2012-08-21 ENCOUNTER — Other Ambulatory Visit: Payer: Medicare Other

## 2012-12-03 DIAGNOSIS — N390 Urinary tract infection, site not specified: Secondary | ICD-10-CM | POA: Diagnosis not present

## 2012-12-03 DIAGNOSIS — Z23 Encounter for immunization: Secondary | ICD-10-CM | POA: Diagnosis not present

## 2012-12-07 DIAGNOSIS — E119 Type 2 diabetes mellitus without complications: Secondary | ICD-10-CM | POA: Diagnosis not present

## 2012-12-07 DIAGNOSIS — H40009 Preglaucoma, unspecified, unspecified eye: Secondary | ICD-10-CM | POA: Diagnosis not present

## 2012-12-07 DIAGNOSIS — H1045 Other chronic allergic conjunctivitis: Secondary | ICD-10-CM | POA: Diagnosis not present

## 2012-12-07 DIAGNOSIS — H2510 Age-related nuclear cataract, unspecified eye: Secondary | ICD-10-CM | POA: Diagnosis not present

## 2013-02-09 DIAGNOSIS — H2510 Age-related nuclear cataract, unspecified eye: Secondary | ICD-10-CM | POA: Diagnosis not present

## 2013-02-09 DIAGNOSIS — H25049 Posterior subcapsular polar age-related cataract, unspecified eye: Secondary | ICD-10-CM | POA: Diagnosis not present

## 2013-02-09 DIAGNOSIS — H02839 Dermatochalasis of unspecified eye, unspecified eyelid: Secondary | ICD-10-CM | POA: Diagnosis not present

## 2013-02-09 DIAGNOSIS — H25019 Cortical age-related cataract, unspecified eye: Secondary | ICD-10-CM | POA: Diagnosis not present

## 2013-02-09 DIAGNOSIS — H18419 Arcus senilis, unspecified eye: Secondary | ICD-10-CM | POA: Diagnosis not present

## 2013-04-12 DIAGNOSIS — H18419 Arcus senilis, unspecified eye: Secondary | ICD-10-CM | POA: Diagnosis not present

## 2013-04-12 DIAGNOSIS — H25019 Cortical age-related cataract, unspecified eye: Secondary | ICD-10-CM | POA: Diagnosis not present

## 2013-04-12 DIAGNOSIS — H269 Unspecified cataract: Secondary | ICD-10-CM | POA: Diagnosis not present

## 2013-04-12 DIAGNOSIS — H02839 Dermatochalasis of unspecified eye, unspecified eyelid: Secondary | ICD-10-CM | POA: Diagnosis not present

## 2013-04-12 DIAGNOSIS — H2510 Age-related nuclear cataract, unspecified eye: Secondary | ICD-10-CM | POA: Diagnosis not present

## 2013-04-13 DIAGNOSIS — H2510 Age-related nuclear cataract, unspecified eye: Secondary | ICD-10-CM | POA: Diagnosis not present

## 2013-07-20 ENCOUNTER — Other Ambulatory Visit: Payer: Self-pay

## 2013-07-20 DIAGNOSIS — E039 Hypothyroidism, unspecified: Secondary | ICD-10-CM

## 2013-07-20 MED ORDER — LEVOTHYROXINE SODIUM 125 MCG PO TABS: ORAL_TABLET | ORAL | Status: DC

## 2013-07-20 NOTE — Telephone Encounter (Signed)
Received request for refill. 

## 2013-09-13 ENCOUNTER — Encounter: Payer: Self-pay | Admitting: Emergency Medicine

## 2013-09-13 ENCOUNTER — Ambulatory Visit (INDEPENDENT_AMBULATORY_CARE_PROVIDER_SITE_OTHER): Payer: Medicare Other | Admitting: Emergency Medicine

## 2013-09-13 VITALS — BP 116/72 | HR 72 | Temp 98.3°F | Resp 18 | Ht 64.5 in | Wt 195.8 lb

## 2013-09-13 DIAGNOSIS — R7303 Prediabetes: Secondary | ICD-10-CM

## 2013-09-13 DIAGNOSIS — M109 Gout, unspecified: Secondary | ICD-10-CM | POA: Diagnosis not present

## 2013-09-13 DIAGNOSIS — R918 Other nonspecific abnormal finding of lung field: Secondary | ICD-10-CM | POA: Diagnosis not present

## 2013-09-13 DIAGNOSIS — R9389 Abnormal findings on diagnostic imaging of other specified body structures: Secondary | ICD-10-CM

## 2013-09-13 DIAGNOSIS — E079 Disorder of thyroid, unspecified: Secondary | ICD-10-CM | POA: Diagnosis not present

## 2013-09-13 DIAGNOSIS — R739 Hyperglycemia, unspecified: Secondary | ICD-10-CM

## 2013-09-13 DIAGNOSIS — E785 Hyperlipidemia, unspecified: Secondary | ICD-10-CM

## 2013-09-13 DIAGNOSIS — I10 Essential (primary) hypertension: Secondary | ICD-10-CM

## 2013-09-13 DIAGNOSIS — R7309 Other abnormal glucose: Secondary | ICD-10-CM | POA: Diagnosis not present

## 2013-09-13 DIAGNOSIS — E039 Hypothyroidism, unspecified: Secondary | ICD-10-CM

## 2013-09-13 LAB — CBC WITH DIFFERENTIAL/PLATELET
Basophils Absolute: 0.1 10*3/uL (ref 0.0–0.1)
Basophils Relative: 1 % (ref 0–1)
EOS PCT: 3 % (ref 0–5)
Eosinophils Absolute: 0.2 10*3/uL (ref 0.0–0.7)
HCT: 39.8 % (ref 36.0–46.0)
Hemoglobin: 13.6 g/dL (ref 12.0–15.0)
LYMPHS ABS: 2.3 10*3/uL (ref 0.7–4.0)
Lymphocytes Relative: 29 % (ref 12–46)
MCH: 29.8 pg (ref 26.0–34.0)
MCHC: 34.2 g/dL (ref 30.0–36.0)
MCV: 87.3 fL (ref 78.0–100.0)
Monocytes Absolute: 0.5 10*3/uL (ref 0.1–1.0)
Monocytes Relative: 6 % (ref 3–12)
Neutro Abs: 4.8 10*3/uL (ref 1.7–7.7)
Neutrophils Relative %: 61 % (ref 43–77)
PLATELETS: 318 10*3/uL (ref 150–400)
RBC: 4.56 MIL/uL (ref 3.87–5.11)
RDW: 14.2 % (ref 11.5–15.5)
WBC: 7.9 10*3/uL (ref 4.0–10.5)

## 2013-09-13 LAB — COMPREHENSIVE METABOLIC PANEL
ALT: 53 U/L — AB (ref 0–35)
AST: 68 U/L — ABNORMAL HIGH (ref 0–37)
Albumin: 4.2 g/dL (ref 3.5–5.2)
Alkaline Phosphatase: 93 U/L (ref 39–117)
BILIRUBIN TOTAL: 0.6 mg/dL (ref 0.2–1.2)
BUN: 21 mg/dL (ref 6–23)
CO2: 24 meq/L (ref 19–32)
CREATININE: 0.79 mg/dL (ref 0.50–1.10)
Calcium: 9.6 mg/dL (ref 8.4–10.5)
Chloride: 100 mEq/L (ref 96–112)
Glucose, Bld: 150 mg/dL — ABNORMAL HIGH (ref 70–99)
Potassium: 4.6 mEq/L (ref 3.5–5.3)
Sodium: 136 mEq/L (ref 135–145)
Total Protein: 7.6 g/dL (ref 6.0–8.3)

## 2013-09-13 LAB — LIPID PANEL
CHOL/HDL RATIO: 6.7 ratio
CHOLESTEROL: 295 mg/dL — AB (ref 0–200)
HDL: 44 mg/dL (ref >39)
LDL CALC: 176 mg/dL — AB (ref 0–99)
TRIGLYCERIDES: 377 mg/dL — AB (ref <150)
VLDL: 75 mg/dL — AB (ref 0–40)

## 2013-09-13 LAB — GLUCOSE, POCT (MANUAL RESULT ENTRY): POC Glucose: 141 mg/dl — AB (ref 70–99)

## 2013-09-13 LAB — URIC ACID: URIC ACID, SERUM: 9.3 mg/dL — AB (ref 2.4–7.0)

## 2013-09-13 LAB — POCT GLYCOSYLATED HEMOGLOBIN (HGB A1C): Hemoglobin A1C: 7.9

## 2013-09-13 LAB — TSH: TSH: 2.711 u[IU]/mL (ref 0.350–4.500)

## 2013-09-13 MED ORDER — LISINOPRIL 20 MG PO TABS: 20.0000 mg | ORAL_TABLET | Freq: Every day | ORAL | Status: DC

## 2013-09-13 MED ORDER — METFORMIN HCL 500 MG PO TABS: 500.0000 mg | ORAL_TABLET | Freq: Two times a day (BID) | ORAL | Status: DC

## 2013-09-13 MED ORDER — LEVOTHYROXINE SODIUM 125 MCG PO TABS: ORAL_TABLET | ORAL | Status: DC

## 2013-09-13 NOTE — Progress Notes (Signed)
   Subjective:    Patient ID: Olivia Robinson, female    DOB: Sep 26, 1934, 79 y.o.   MRN: [id number]  HPI for followup diabetes hypothyroidism and hypertension. She denies any complaints and is here on to get her prescriptions refilled. For a CT of the chest but did not go. She is known to have a pulmonary nodule they are  following. She denies any cardiac complaint    Review of Systems     Objective:   Physical Exam HEENT exam is unremarkable neck supple chest clear heart regular rate no murmurs abdomen obese without tenderness extremities without edema     Results for orders placed in visit on 09/13/13  GLUCOSE, POCT (MANUAL RESULT ENTRY)      Result Value Ref Range   POC Glucose 141 (*) 70 - 99 mg/dl  POCT GLYCOSYLATED HEMOGLOBIN (HGB A1C)      Result Value Ref Range   Hemoglobin A1C 7.9        Assessment & Plan:  She will take her medications as instructed. She was scheduled for CT of the chest but did not go. She refused to have an EKG done. She refused to have a mammogram ordered. She states she wouldn't come in in 3-4 months and I hope she does. She did mark some depressive symptoms on her questionnaire. She initially wanted prescriptions to be sent in for 2 years so it is unclear whether she will followup or not. She refused to take her statin. She's not currently taking allopurinol. Hopefully she will followup so we can discuss some of these issues. And update her immunizations and give her the appropriate screening procedures she needs

## 2013-09-14 ENCOUNTER — Telehealth: Payer: Self-pay | Admitting: *Deleted

## 2013-09-14 NOTE — Telephone Encounter (Signed)
Pt called back in regards to message left for labs. Discussed labs with her and that she was advised to return to discuss results with Dr. Everlene Farrier. She expressed understanding

## 2013-09-20 ENCOUNTER — Ambulatory Visit
Admission: RE | Admit: 2013-09-20 | Discharge: 2013-09-20 | Disposition: A | Discharge status: Home / Self Care | Payer: Medicare Other | Source: Ambulatory Visit | Attending: Emergency Medicine | Admitting: Emergency Medicine

## 2013-09-20 ENCOUNTER — Other Ambulatory Visit: Payer: Medicare Other

## 2013-09-20 ENCOUNTER — Other Ambulatory Visit: Payer: Self-pay | Admitting: Emergency Medicine

## 2013-09-20 DIAGNOSIS — J984 Other disorders of lung: Secondary | ICD-10-CM | POA: Diagnosis not present

## 2013-09-20 DIAGNOSIS — R9389 Abnormal findings on diagnostic imaging of other specified body structures: Secondary | ICD-10-CM

## 2013-09-23 NOTE — Telephone Encounter (Signed)
Pt calling about lab results. They have not been reviewed yet. Can we review them so we can let her know? Thanks!

## 2013-09-24 NOTE — Telephone Encounter (Signed)
Copied from the Result Notes associated with her visit here 615/2015:   Notes Recorded by Belva Chimes, LPN on 05/11/4707 at 62:83 AM Lm for rtn call Notes Recorded by Darlyne Russian, MD on 09/14/2013 at 8:11 AM Please call patient. Her liver tests are elevated her cholesterol is up significantly. Her uric acid is elevated. I would advise she make an appointment in the near future to discuss all these findings. Her sugar was 150. She is definitely a very high risk to develop coronary disease. I would suggest we get her an appointment to see one of the cardiologists for evaluation. Palate also strongly advised she come in to see me to discuss all of these abnormal findings and decide on a treatment plan.  Also, per THIS encounter, her labs were discussed with her by phone on 09/14/13. I'm not sure which results she means that haven't been reviewed or discussed with her.

## 2013-09-24 NOTE — Telephone Encounter (Signed)
lmom for pt to cb

## 2013-09-25 NOTE — Telephone Encounter (Signed)
Pt said someone spoke to her about her labs yesterday.

## 2013-11-24 IMAGING — CT CT CHEST W/ CM
2 of 4 series · 15 of 36 positions shown, 18 images · IV contrast (75CC OMNI 300)
Comparison: Chest x-ray 07/29/2011.

CLINICAL DATA: Persistent cough.

BUN and creatinine were obtained on site at [HOSPITAL] at
[HOSPITAL]..
Results:  BUN 19.0 mg/dL,  Creatinine 0.7 mg/dL.
CT CHEST WITH CONTRAST
TECHNIQUE: Multidetector CT imaging of the chest was performed
following the standard protocol during bolus administration of
intravenous contrast.
Contrast: 75mL OMNIPAQUE IOHEXOL 300 MG/ML  SOLN

[Series 2: chest with · axial · 0.70mm/px · z∈[-272,-8]mm · 12 of 63 slices shown, 15 images]
[im 5/63  mediastinal]
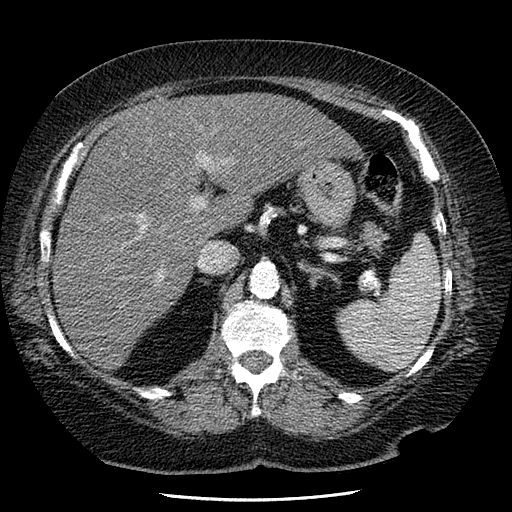
[im 5/63  lung]
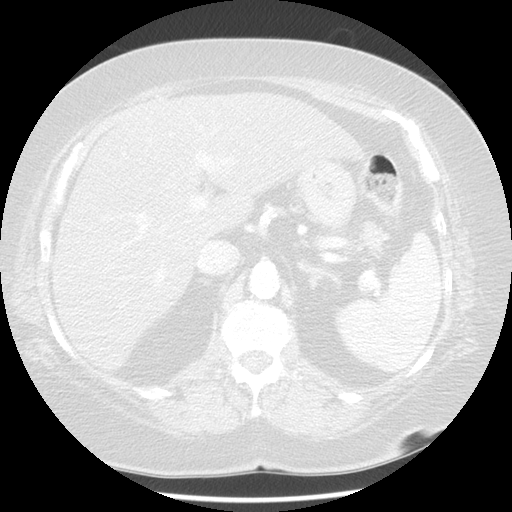
[im 10/63  lung]
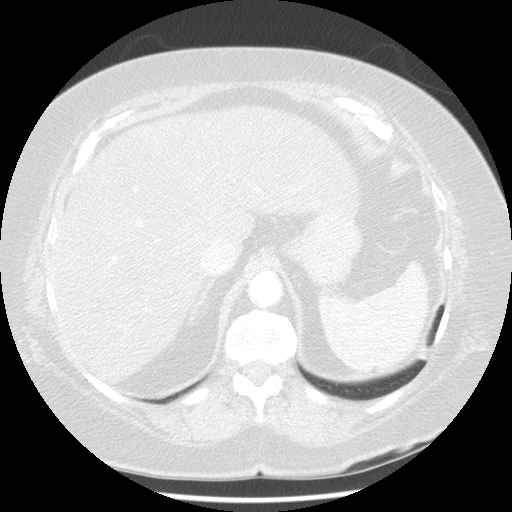
[im 15/63  lung]
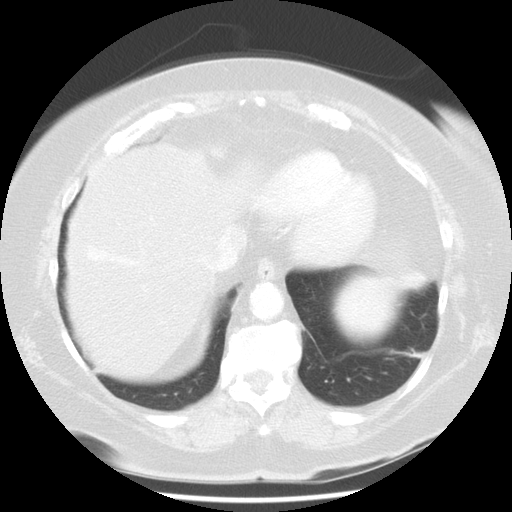
[im 20/63  lung]
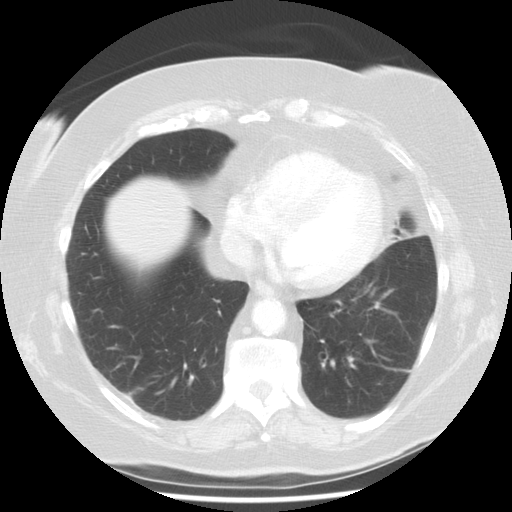
[im 24/63  mediastinal]
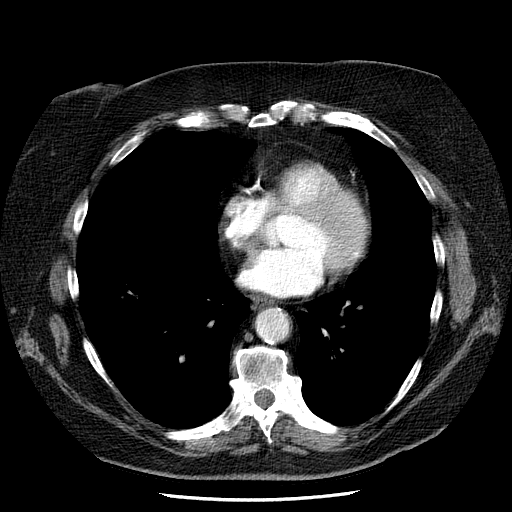
[im 24/63  lung]
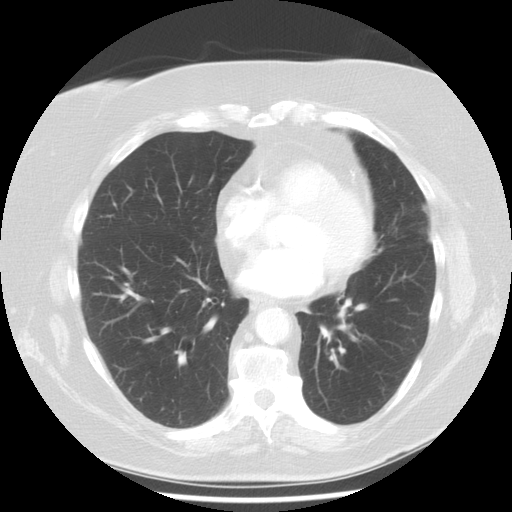
[im 29/63  lung]
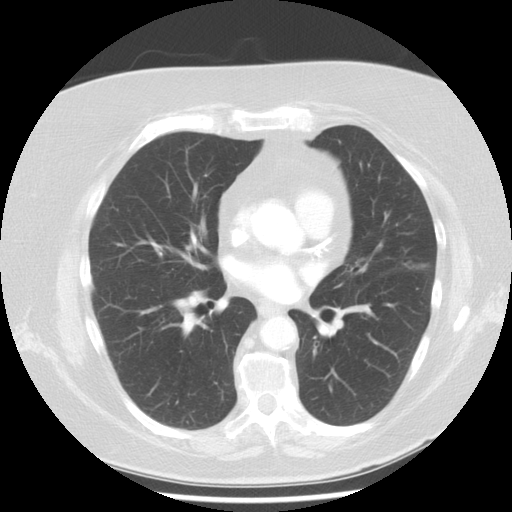
[im 34/63  lung]
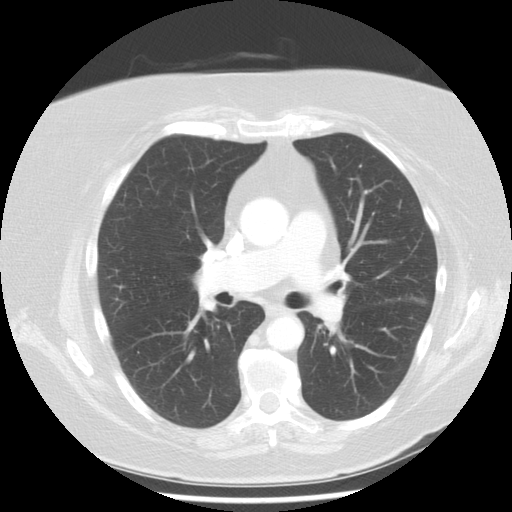
[im 39/63  lung]
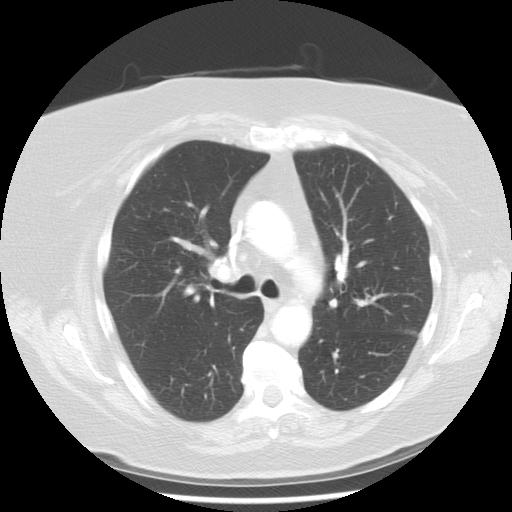
[im 43/63  mediastinal]
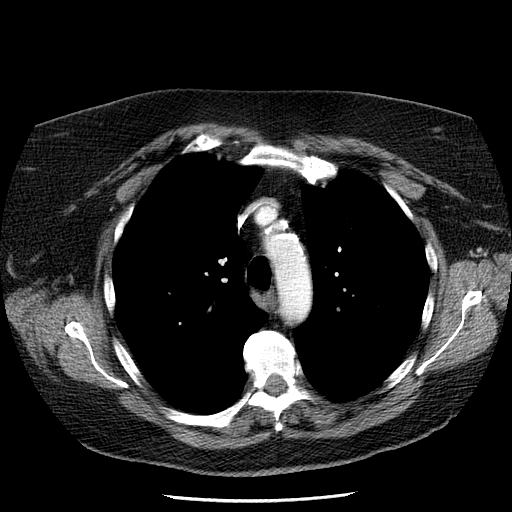
[im 43/63  lung]
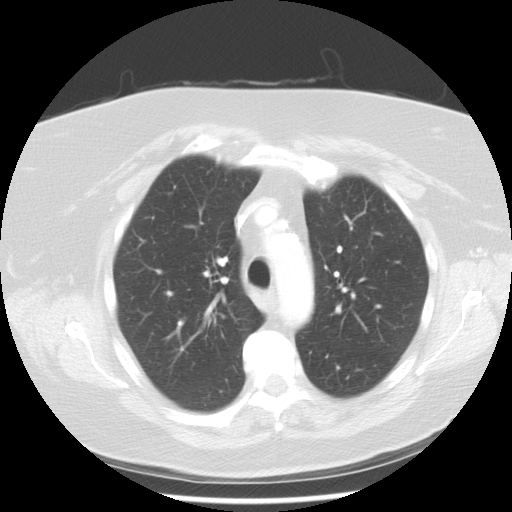
[im 48/63  lung]
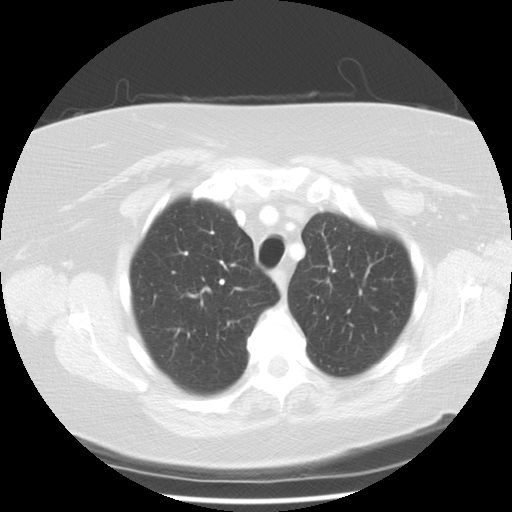
[im 53/63  lung]
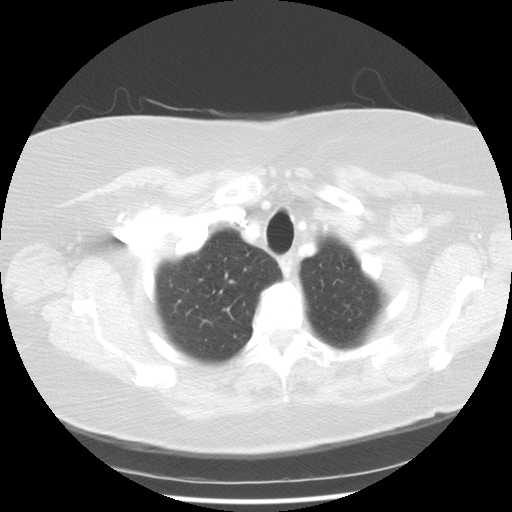
[im 58/63  lung]
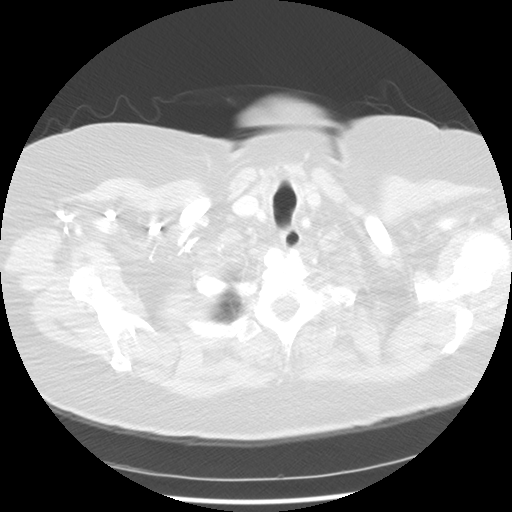

[Series 400: coronal · coronal · 0.70mm/px · 3 of 131 slices shown]
[im 27/131  lung]
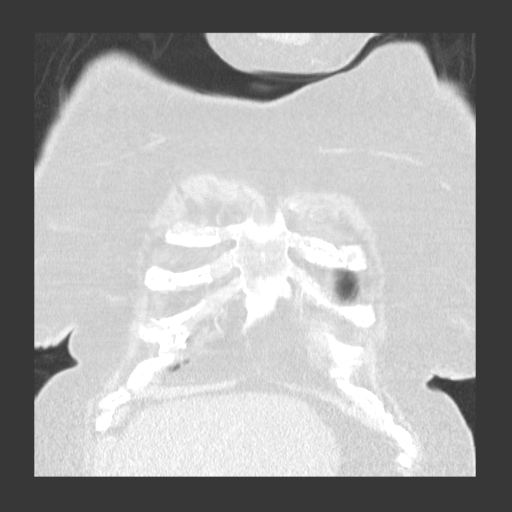
[im 53/131  lung]
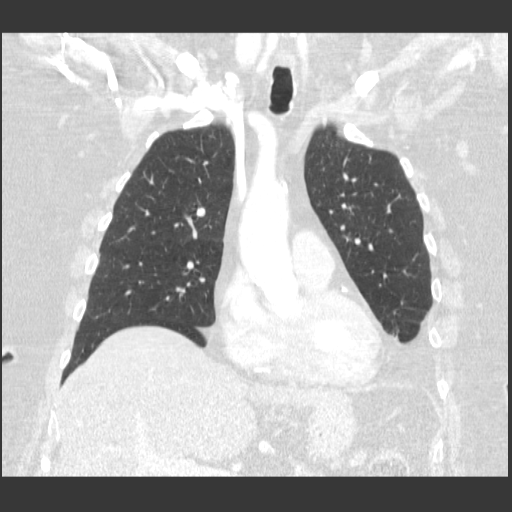
[im 79/131  lung]
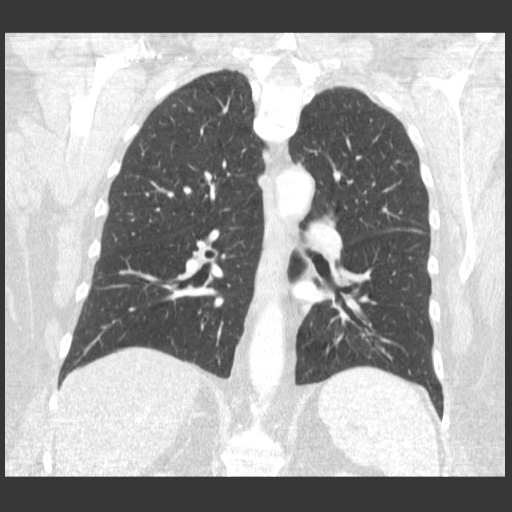

[15 of 36 positions shown; findings below may reference images not displayed]

FINDINGS: The chest wall is unremarkable.  No breast masses,
supraclavicular or axillary lymphadenopathy.  The bony thorax is
intact.  No destructive bone lesions or spinal canal compromise.
There is a mild scoliotic curvature and asymmetry the bony thorax.
Moderate degenerative changes throughout the thoracic spine.

The heart is normal in size.  No pericardial effusion.  No
mediastinal or hilar lymphadenopathy.  The aorta demonstrates
moderate atherosclerotic changes but no focal aneurysm or
dissection.  Dense three-vessel coronary artery calcifications are
noted.  The esophagus is grossly normal.

Examination of the lung parenchyma demonstrates an 8 mm ill-defined
somewhat spiculated soft tissue density in the right upper lobe on
image number 16.  There are lingular scarring changes and mild
pleural thickening along with linear bands of bibasilar scarring.
No mass lesion or bronchiectasis.  No findings for interstitial
lung disease or emphysema.  The tracheobronchial tree is grossly
normal.  No pleural effusion.

The upper abdomen demonstrates diffuse fatty infiltration of the
liver but no focal hepatic lesions.  The adrenal glands are normal.
Advanced atherosclerotic calcifications involving the upper
abdominal aorta.
IMPRESSION: 1.  Ill-defined 8 mm nodular density in the right upper lobe has
the appearance of confluent scarring changes, particularly on the
sagittal and coronal images.  Close observation is recommended.  A
repeat noncontrast chest CT in 4-6 months is suggested.
2.  No mediastinal or hilar lymphadenopathy or mass.
3.  Advanced atherosclerotic calcifications involving the aorta and
coronary arteries.
4.  Diffuse fatty infiltration of the liver.

## 2014-01-10 DIAGNOSIS — L570 Actinic keratosis: Secondary | ICD-10-CM | POA: Diagnosis not present

## 2014-01-10 DIAGNOSIS — D1801 Hemangioma of skin and subcutaneous tissue: Secondary | ICD-10-CM | POA: Diagnosis not present

## 2014-01-10 DIAGNOSIS — L718 Other rosacea: Secondary | ICD-10-CM | POA: Diagnosis not present

## 2014-01-10 DIAGNOSIS — L918 Other hypertrophic disorders of the skin: Secondary | ICD-10-CM | POA: Diagnosis not present

## 2014-01-10 DIAGNOSIS — L821 Other seborrheic keratosis: Secondary | ICD-10-CM | POA: Diagnosis not present

## 2014-01-10 DIAGNOSIS — D485 Neoplasm of uncertain behavior of skin: Secondary | ICD-10-CM | POA: Diagnosis not present

## 2014-01-13 ENCOUNTER — Ambulatory Visit: Payer: Medicare Other | Admitting: Emergency Medicine

## 2014-01-28 ENCOUNTER — Encounter: Payer: Self-pay | Admitting: Emergency Medicine

## 2014-01-28 ENCOUNTER — Ambulatory Visit (INDEPENDENT_AMBULATORY_CARE_PROVIDER_SITE_OTHER): Payer: Medicare Other | Admitting: Emergency Medicine

## 2014-01-28 VITALS — BP 141/75 | HR 70 | Temp 97.0°F | Resp 16 | Ht 64.0 in | Wt 197.4 lb

## 2014-01-28 DIAGNOSIS — E785 Hyperlipidemia, unspecified: Secondary | ICD-10-CM

## 2014-01-28 DIAGNOSIS — Z23 Encounter for immunization: Secondary | ICD-10-CM

## 2014-01-28 DIAGNOSIS — M109 Gout, unspecified: Secondary | ICD-10-CM | POA: Diagnosis not present

## 2014-01-28 NOTE — Progress Notes (Signed)
   Subjective:    Patient ID: Olivia Robinson, female    DOB: Feb 05, 1935, 79 y.o.   MRN: [id number]  HPI patient in for follow-up. She states she feels great is not having any problems. She denies any chest pain or shortness of breath. She states she has been taking her medication. She did have a follow-up CT scan to evaluate an abnormal area seen on previous films. This area appeared to be stable and secondary to scar. No follow-up was recommended. She has not had her colonoscopy and does not plan to. She has not had her mammogram and does not 1 to have one.    Review of Systems     Objective:   Physical Exam  Constitutional: She is oriented to person, place, and time. She appears well-developed and well-nourished.  HENT:  Head: Normocephalic.  Eyes: Pupils are equal, round, and reactive to light.  Neck: No thyromegaly present.  Cardiovascular: Normal rate and regular rhythm.   No murmur heard. Pulmonary/Chest: Effort normal and breath sounds normal. No respiratory distress. She has no wheezes.  Abdominal: Soft. There is no tenderness.  Neurological: She is alert and oriented to person, place, and time.  Skin: Skin is warm and dry.  No orders of the defined types were placed in this encounter.       Assessment & Plan:  Labs done. Flu shot given.No changes.

## 2014-01-29 LAB — CBC WITH DIFFERENTIAL/PLATELET
BASOS ABS: 0.1 10*3/uL (ref 0.0–0.1)
BASOS PCT: 1 % (ref 0–1)
EOS ABS: 0.2 10*3/uL (ref 0.0–0.7)
EOS PCT: 3 % (ref 0–5)
HEMATOCRIT: 38.4 % (ref 36.0–46.0)
Hemoglobin: 13 g/dL (ref 12.0–15.0)
Lymphocytes Relative: 31 % (ref 12–46)
Lymphs Abs: 2.2 10*3/uL (ref 0.7–4.0)
MCH: 30.1 pg (ref 26.0–34.0)
MCHC: 33.9 g/dL (ref 30.0–36.0)
MCV: 88.9 fL (ref 78.0–100.0)
MONO ABS: 0.4 10*3/uL (ref 0.1–1.0)
Monocytes Relative: 5 % (ref 3–12)
Neutro Abs: 4.2 10*3/uL (ref 1.7–7.7)
Neutrophils Relative %: 60 % (ref 43–77)
Platelets: 281 10*3/uL (ref 150–400)
RBC: 4.32 MIL/uL (ref 3.87–5.11)
RDW: 14 % (ref 11.5–15.5)
WBC: 7 10*3/uL (ref 4.0–10.5)

## 2014-01-29 LAB — MICROALBUMIN, URINE: Microalb, Ur: 5.9 mg/dL — ABNORMAL HIGH (ref <2.0)

## 2014-01-31 LAB — COMPLETE METABOLIC PANEL WITH GFR

## 2014-01-31 LAB — LIPID PANEL

## 2014-02-01 ENCOUNTER — Other Ambulatory Visit: Payer: Self-pay | Admitting: *Deleted

## 2014-02-01 DIAGNOSIS — I10 Essential (primary) hypertension: Secondary | ICD-10-CM

## 2014-02-01 DIAGNOSIS — E785 Hyperlipidemia, unspecified: Secondary | ICD-10-CM

## 2014-05-21 ENCOUNTER — Other Ambulatory Visit: Payer: Self-pay | Admitting: Family Medicine

## 2014-06-03 ENCOUNTER — Ambulatory Visit (INDEPENDENT_AMBULATORY_CARE_PROVIDER_SITE_OTHER): Payer: Medicare Other | Admitting: Emergency Medicine

## 2014-06-03 ENCOUNTER — Ambulatory Visit (INDEPENDENT_AMBULATORY_CARE_PROVIDER_SITE_OTHER): Payer: Medicare Other

## 2014-06-03 VITALS — BP 130/78 | HR 73 | Temp 98.2°F | Resp 16 | Wt 184.6 lb

## 2014-06-03 DIAGNOSIS — R05 Cough: Secondary | ICD-10-CM

## 2014-06-03 DIAGNOSIS — M1000 Idiopathic gout, unspecified site: Secondary | ICD-10-CM | POA: Diagnosis not present

## 2014-06-03 DIAGNOSIS — E079 Disorder of thyroid, unspecified: Secondary | ICD-10-CM | POA: Diagnosis not present

## 2014-06-03 DIAGNOSIS — R059 Cough, unspecified: Secondary | ICD-10-CM

## 2014-06-03 DIAGNOSIS — R739 Hyperglycemia, unspecified: Secondary | ICD-10-CM

## 2014-06-03 DIAGNOSIS — I10 Essential (primary) hypertension: Secondary | ICD-10-CM | POA: Diagnosis not present

## 2014-06-03 DIAGNOSIS — N8189 Other female genital prolapse: Secondary | ICD-10-CM

## 2014-06-03 DIAGNOSIS — E785 Hyperlipidemia, unspecified: Secondary | ICD-10-CM

## 2014-06-03 DIAGNOSIS — R7309 Other abnormal glucose: Secondary | ICD-10-CM | POA: Diagnosis not present

## 2014-06-03 DIAGNOSIS — R350 Frequency of micturition: Secondary | ICD-10-CM

## 2014-06-03 LAB — POCT UA - MICROSCOPIC ONLY
Casts, Ur, LPF, POC: NEGATIVE
Crystals, Ur, HPF, POC: NEGATIVE
MUCUS UA: NEGATIVE
YEAST UA: NEGATIVE

## 2014-06-03 LAB — POCT URINALYSIS DIPSTICK
Bilirubin, UA: NEGATIVE
Blood, UA: NEGATIVE
Glucose, UA: NEGATIVE
Ketones, UA: NEGATIVE
NITRITE UA: NEGATIVE
PH UA: 5.5
PROTEIN UA: NEGATIVE
SPEC GRAV UA: 1.02
Urobilinogen, UA: 0.2

## 2014-06-03 LAB — COMPREHENSIVE METABOLIC PANEL
ALBUMIN: 4.2 g/dL (ref 3.5–5.2)
ALT: 35 U/L (ref 0–35)
AST: 34 U/L (ref 0–37)
Alkaline Phosphatase: 141 U/L — ABNORMAL HIGH (ref 39–117)
BILIRUBIN TOTAL: 0.6 mg/dL (ref 0.2–1.2)
BUN: 21 mg/dL (ref 6–23)
CALCIUM: 9.7 mg/dL (ref 8.4–10.5)
CO2: 23 meq/L (ref 19–32)
CREATININE: 0.8 mg/dL (ref 0.50–1.10)
Chloride: 103 mEq/L (ref 96–112)
GLUCOSE: 160 mg/dL — AB (ref 70–99)
Potassium: 4.6 mEq/L (ref 3.5–5.3)
Sodium: 137 mEq/L (ref 135–145)
Total Protein: 7.4 g/dL (ref 6.0–8.3)

## 2014-06-03 LAB — POCT CBC
GRANULOCYTE PERCENT: 66.8 % (ref 37–80)
HCT, POC: 41 % (ref 37.7–47.9)
HEMOGLOBIN: 13.2 g/dL (ref 12.2–16.2)
Lymph, poc: 2.4 (ref 0.6–3.4)
MCH, POC: 29.6 pg (ref 27–31.2)
MCHC: 32.2 g/dL (ref 31.8–35.4)
MCV: 91.7 fL (ref 80–97)
MID (cbc): 0.3 (ref 0–0.9)
MPV: 7.8 fL (ref 0–99.8)
POC GRANULOCYTE: 5.3 (ref 2–6.9)
POC LYMPH PERCENT: 29.4 %L (ref 10–50)
POC MID %: 3.8 %M (ref 0–12)
Platelet Count, POC: 322 10*3/uL (ref 142–424)
RBC: 4.47 M/uL (ref 4.04–5.48)
RDW, POC: 14.1 %
WBC: 8 10*3/uL (ref 4.6–10.2)

## 2014-06-03 LAB — LIPID PANEL
Cholesterol: 282 mg/dL — ABNORMAL HIGH (ref 0–200)
HDL: 38 mg/dL — ABNORMAL LOW (ref >=46)
LDL CALC: 188 mg/dL — AB (ref 0–99)
Total CHOL/HDL Ratio: 7.4 Ratio
Triglycerides: 281 mg/dL — ABNORMAL HIGH (ref <150)
VLDL: 56 mg/dL — ABNORMAL HIGH (ref 0–40)

## 2014-06-03 LAB — TSH: TSH: 1.054 u[IU]/mL (ref 0.350–4.500)

## 2014-06-03 LAB — T4, FREE: Free T4: 1.48 ng/dL (ref 0.80–1.80)

## 2014-06-03 LAB — POCT GLYCOSYLATED HEMOGLOBIN (HGB A1C): Hemoglobin A1C: 7.3

## 2014-06-03 LAB — URIC ACID: Uric Acid, Serum: 8.4 mg/dL — ABNORMAL HIGH (ref 2.4–7.0)

## 2014-06-03 LAB — GLUCOSE, POCT (MANUAL RESULT ENTRY): POC Glucose: 147 mg/dl — AB (ref 70–99)

## 2014-06-03 MED ORDER — LOSARTAN POTASSIUM 50 MG PO TABS: 50.0000 mg | ORAL_TABLET | Freq: Every day | ORAL | Status: DC

## 2014-06-03 MED ORDER — BENZONATATE 100 MG PO CAPS
100.0000 mg | ORAL_CAPSULE | Freq: Three times a day (TID) | ORAL | Status: DC | PRN
Start: 2014-06-03 — End: 2014-12-15

## 2014-06-03 MED ORDER — ALLOPURINOL 100 MG PO TABS: ORAL_TABLET | ORAL | Status: DC

## 2014-06-03 NOTE — Patient Instructions (Signed)
Cough, Adult  A cough is a reflex that helps clear your throat and airways. It can help heal the body or may be a reaction to an irritated airway. A cough may only last 2 or 3 weeks (acute) or may last more than 8 weeks (chronic).  CAUSES Acute cough:  Viral or bacterial infections. Chronic cough:  Infections.  Allergies.  Asthma.  Post-nasal drip.  Smoking.  Heartburn or acid reflux.  Some medicines.  Chronic lung problems (COPD).  Cancer. SYMPTOMS   Cough.  Fever.  Chest pain.  Increased breathing rate.  High-pitched whistling sound when breathing (wheezing).  Colored mucus that you cough up (sputum). TREATMENT   A bacterial cough may be treated with antibiotic medicine.  A viral cough must run its course and will not respond to antibiotics.  Your caregiver may recommend other treatments if you have a chronic cough. HOME CARE INSTRUCTIONS   Only take over-the-counter or prescription medicines for pain, discomfort, or fever as directed by your caregiver. Use cough suppressants only as directed by your caregiver.  Use a cold steam vaporizer or humidifier in your bedroom or home to help loosen secretions.  Sleep in a semi-upright position if your cough is worse at night.  Rest as needed.  Stop smoking if you smoke. SEEK IMMEDIATE MEDICAL CARE IF:   You have pus in your sputum.  Your cough starts to worsen.  You cannot control your cough with suppressants and are losing sleep.  You begin coughing up blood.  You have difficulty breathing.  You develop pain which is getting worse or is uncontrolled with medicine.  You have a fever. MAKE SURE YOU:   Understand these instructions.  Will watch your condition.  Will get help right away if you are not doing well or get worse. Document Released: 09/14/2010 Document Revised: 06/10/2011 Document Reviewed: 09/14/2010 ExitCare Patient Information 2015 [employer]. This information is not intended  to replace advice given to you by your health care provider. Make sure you discuss any questions you have with your health care provider.  

## 2014-06-03 NOTE — Progress Notes (Addendum)
Subjective:    Patient ID: Olivia Robinson, female    DOB: 08-26-1934, 80 y.o.   MRN: [id number] This chart was scribed for Arlyss Queen, MD by Zola Button, Medical Scribe. This patient was seen in room 3 and the patient's care was started at 9:37 AM.   HPI HPI Comments: Olivia Robinson is a 80 y.o. female with a hx of HTN, hypothyroidism, CAD, HLD and gout who presents to the [facility] complaining of gradual onset cough that started a few days ago. Patient denies fever. She states she has been under a lot of stress lately because her daughter has had 3 eye surgeries in the past month and a half for thyroid eye disease. She thinks the stress may be related to the cough.  Patient states she stopped taking her gout medication 2-3 years ago because it had resolved; however, she notes that it returned recently and would like her gout medication refilled.  She also notes having some bladder issues, stating that she has to push up on her bladder in order to urinate. Patient may be interested in having surgery for her bladder.  Patient does not check her CBG at home.  Review of Systems  Constitutional: Negative for fever.  Respiratory: Positive for cough.   Genitourinary: Positive for difficulty urinating.       Objective:   Physical Exam CONSTITUTIONAL: Well developed/well nourished HEAD: Normocephalic/atraumatic EYES: EOM/PERRL ENMT: Mucous membranes moist NECK: supple no meningeal signs SPINE: entire spine nontender CV: S1/S2 noted, no murmurs/rubs/gallops noted LUNGS: Lungs are clear to auscultation bilaterally, no apparent distress ABDOMEN: soft, nontender, no rebound or guarding GU: no cva tenderness NEURO: Pt is awake/alert, moves all extremitiesx4 EXTREMITIES: pulses normal, full ROM SKIN: warm, color normal PSYCH: no abnormalities of mood noted Results for orders placed or performed in visit on 06/03/14  POCT CBC  Result Value Ref Range   WBC 8.0 4.6  - 10.2 K/uL   Lymph, poc 2.4 0.6 - 3.4   POC LYMPH PERCENT 29.4 10 - 50 %L   MID (cbc) 0.3 0 - 0.9   POC MID % 3.8 0 - 12 %M   POC Granulocyte 5.3 2 - 6.9   Granulocyte percent 66.8 37 - 80 %G   RBC 4.47 4.04 - 5.48 M/uL   Hemoglobin 13.2 12.2 - 16.2 g/dL   HCT, POC 41.0 37.7 - 47.9 %   MCV 91.7 80 - 97 fL   MCH, POC 29.6 27 - 31.2 pg   MCHC 32.2 31.8 - 35.4 g/dL   RDW, POC 14.1 %   Platelet Count, POC 322 142 - 424 K/uL   MPV 7.8 0 - 99.8 fL  POCT glucose (manual entry)  Result Value Ref Range   POC Glucose 147 (A) 70 - 99 mg/dl  POCT glycosylated hemoglobin (Hb A1C)  Result Value Ref Range   Hemoglobin A1C 7.3   POCT UA - Microscopic Only  Result Value Ref Range   WBC, Ur, HPF, POC 0-2    RBC, urine, microscopic 0-1    Bacteria, U Microscopic 1+    Mucus, UA neg    Epithelial cells, urine per micros 5-9    Crystals, Ur, HPF, POC neg    Casts, Ur, LPF, POC neg    Yeast, UA neg   POCT urinalysis dipstick  Result Value Ref Range   Color, UA yellow    Clarity, UA clear    Glucose, UA neg  Bilirubin, UA neg    Ketones, UA neg    Spec Grav, UA 1.020    Blood, UA neg    pH, UA 5.5    Protein, UA neg    Urobilinogen, UA 0.2    Nitrite, UA neg    Leukocytes, UA small (1+)    [facility] reading (PRIMARY) by  Dr.Dantavious Snowball no acute disease       Assessment & Plan:  Suggested repeat referral to urology did discuss treatment of her incontinence and bladder prolapse. Hemoglobin A1c is slightly better at 7.3. Will change her blood pressure medicine to an ARB because of her cough. Meds will be refilled.I personally performed the services described in this documentation, which was scribed in my presence. The recorded information has been reviewed and is accurate.

## 2014-06-06 LAB — URINE CULTURE: Colony Count: 95000

## 2014-06-10 ENCOUNTER — Telehealth: Payer: Self-pay

## 2014-06-10 NOTE — Telephone Encounter (Signed)
Pt states that Dr. Everlene Farrier has had him on lisinopril (PRINIVIL,ZESTRIL) 20 MG tablet [[id number]] DISCONTINUED and he just realized that he now has losartan (COZAAR) 50 MG tablet [[id number]] , he would like to know if this was a mistake, or if this is what he is supposed to be taking now?

## 2014-06-10 NOTE — Telephone Encounter (Signed)
Assessment & Plan:  Suggested repeat referral to urology did discuss treatment of her incontinence and bladder prolapse. Hemoglobin A1c is slightly better at 7.3. Will change her blood pressure medicine to an ARB because of her cough. Meds will be refilled.I personally performed the services described in this documentation, which was scribed in my presence. The recorded information has been reviewed and is accurate.         Spoke with pt, advised the Losartan is correct. Pt understood.

## 2014-06-14 ENCOUNTER — Ambulatory Visit (INDEPENDENT_AMBULATORY_CARE_PROVIDER_SITE_OTHER): Payer: Medicare Other | Admitting: Emergency Medicine

## 2014-06-14 ENCOUNTER — Encounter: Payer: Self-pay | Admitting: Emergency Medicine

## 2014-06-14 VITALS — BP 128/64 | HR 72 | Temp 98.4°F | Resp 16 | Ht 65.0 in | Wt 187.0 lb

## 2014-06-14 DIAGNOSIS — E039 Hypothyroidism, unspecified: Secondary | ICD-10-CM

## 2014-06-14 DIAGNOSIS — R739 Hyperglycemia, unspecified: Secondary | ICD-10-CM | POA: Diagnosis not present

## 2014-06-14 DIAGNOSIS — R7309 Other abnormal glucose: Secondary | ICD-10-CM

## 2014-06-14 DIAGNOSIS — R7303 Prediabetes: Secondary | ICD-10-CM

## 2014-06-14 MED ORDER — LEVOTHYROXINE SODIUM 125 MCG PO TABS: ORAL_TABLET | ORAL | Status: DC

## 2014-06-14 MED ORDER — LOSARTAN POTASSIUM 50 MG PO TABS: 50.0000 mg | ORAL_TABLET | Freq: Every day | ORAL | Status: DC

## 2014-06-14 MED ORDER — METFORMIN HCL 500 MG PO TABS: ORAL_TABLET | ORAL | Status: DC

## 2014-06-14 MED ORDER — ALLOPURINOL 100 MG PO TABS: ORAL_TABLET | ORAL | Status: DC

## 2014-06-14 NOTE — Progress Notes (Signed)
Subjective:  This chart was scribed for Darlyne Russian, MD by Ladene Artist, ED Scribe. The patient was seen in room 21. Patient's care was started at 11:18 AM.   Patient ID: Olivia Robinson, female    DOB: 11-11-1934, 80 y.o.   MRN: [id number]  Chief Complaint  Patient presents with  . Follow-up  . Hypertension  . hyperglycemia  . Hypothyroidism  . Medication Refill   HPI HPI Comments: Olivia Robinson is a 80 y.o. female, with a h/o hypothyroidism, HTN, DM, anemia, who presents to the [facility] for a follow-up regarding HTN, hyperglycemia, hypothyroidism and medication refill.   Hypertension Pt's BP medication was changed at last visit due to persistent cough. She states that cough has definitely improved since medication change.   Hyperglycemia  Pt's A1C was 7.3 during last visit on 06/03/14.   Hypothyroidism Pt takes Synthroid regularly. She states that she has been stable.   Environmental Allergies  She reports recent sneezing and postnasal drip that she attributes to allergies.  Past Medical History  Diagnosis Date  . Yeast infection   . Bacterial infection   . Prolapsed bladder   . Varicose veins   . H/O bleeding disorder   . Thyroid disease   . Leaking of urine   . Diabetes mellitus   . Allergy   . Anemia   . Hypertension    Current Outpatient Prescriptions on File Prior to Visit  Medication Sig Dispense Refill  . allopurinol (ZYLOPRIM) 100 MG tablet One daily 30 tablet 11  . Ascorbic Acid (VITAMIN C) 100 MG tablet Take 1,000 mg by mouth 2 (two) times daily.     . benzonatate (TESSALON) 100 MG capsule Take 1-2 capsules (100-200 mg total) by mouth 3 (three) times daily as needed for cough. 40 capsule 0  . cetirizine (ZYRTEC) 10 MG tablet Take 10 mg by mouth daily.    . Cholecalciferol (VITAMIN D PO) Take 1 tablet by mouth daily.    . fluticasone (FLONASE) 50 MCG/ACT nasal spray Place 2 sprays into the nose daily as needed.    Marland Kitchen  levothyroxine (SYNTHROID, LEVOTHROID) 125 MCG tablet 1 tab daily need office visit for further refills 90 tablet 3  . losartan (COZAAR) 50 MG tablet Take 1 tablet (50 mg total) by mouth daily. 90 tablet 3  . metFORMIN (GLUCOPHAGE) 500 MG tablet TAKE ONE TABLET BY MOUTH TWICE DAILY 60 tablet 0  . metFORMIN (GLUCOPHAGE) 500 MG tablet Take 1 tablet (500 mg total) by mouth 2 (two) times daily with a meal. 180 tablet 3  . Probiotic Product (PROBIOTIC DAILY PO) Take 1 tablet by mouth daily.    Marland Kitchen atorvastatin (LIPITOR) 10 MG tablet Take 1 tablet (10 mg total) by mouth daily. (Patient not taking: Reported on 06/14/2014) 90 tablet 3   No current facility-administered medications on file prior to visit.   Allergies  Allergen Reactions  . Ampicillin   . Clindamycin     REACTION: diarrhea  . Nsaids Other (See Comments)    GI Bleed     . Sulfa Antibiotics    Review of Systems  HENT: Positive for postnasal drip and sneezing.   Respiratory: Positive for cough (improved).   Allergic/Immunologic: Positive for environmental allergies.      Objective:   Physical Exam  BP on Examination: 120/62 CONSTITUTIONAL: Well developed/well nourished HEAD: Normocephalic/atraumatic EYES: EOMI/PERRL ENMT: Mucous membranes moist NECK: supple no meningeal signs, thyroid is not enlarged  SPINE/BACK:entire  spine nontender CV: S1/S2 noted, no murmurs/rubs/gallops noted LUNGS: Lungs are clear to auscultation bilaterally, no apparent distress ABDOMEN: soft, nontender, no rebound or guarding, bowel sounds noted throughout abdomen GU:no cva tenderness NEURO: Pt is awake/alert/appropriate, moves all extremitiesx4.  No facial droop.   EXTREMITIES: pulses normal/equal, full ROM SKIN: warm, color normal PSYCH: no abnormalities of mood noted, alert and oriented to situation    Assessment & Plan:   Cough is better with change to losartan. She refuses to take a statin. We'll increase her metformin to 2 in the morning  and one in the evening for her elevated hemoglobin A1c. She declines any other vaccinations.I personally performed the services described in this documentation, which was scribed in my presence. The recorded information has been reviewed and is accurate.

## 2014-06-20 DIAGNOSIS — E119 Type 2 diabetes mellitus without complications: Secondary | ICD-10-CM | POA: Diagnosis not present

## 2014-06-20 DIAGNOSIS — H11153 Pinguecula, bilateral: Secondary | ICD-10-CM | POA: Diagnosis not present

## 2014-06-20 DIAGNOSIS — H25011 Cortical age-related cataract, right eye: Secondary | ICD-10-CM | POA: Diagnosis not present

## 2014-06-20 DIAGNOSIS — Z961 Presence of intraocular lens: Secondary | ICD-10-CM | POA: Diagnosis not present

## 2014-06-20 DIAGNOSIS — S0502XA Injury of conjunctiva and corneal abrasion without foreign body, left eye, initial encounter: Secondary | ICD-10-CM | POA: Diagnosis not present

## 2014-06-20 DIAGNOSIS — H2511 Age-related nuclear cataract, right eye: Secondary | ICD-10-CM | POA: Diagnosis not present

## 2014-06-20 DIAGNOSIS — H04123 Dry eye syndrome of bilateral lacrimal glands: Secondary | ICD-10-CM | POA: Diagnosis not present

## 2014-06-20 DIAGNOSIS — H18413 Arcus senilis, bilateral: Secondary | ICD-10-CM | POA: Diagnosis not present

## 2014-07-26 DIAGNOSIS — R3915 Urgency of urination: Secondary | ICD-10-CM | POA: Diagnosis not present

## 2014-07-26 DIAGNOSIS — N393 Stress incontinence (female) (male): Secondary | ICD-10-CM | POA: Diagnosis not present

## 2014-07-26 DIAGNOSIS — N8111 Cystocele, midline: Secondary | ICD-10-CM | POA: Diagnosis not present

## 2014-12-15 ENCOUNTER — Ambulatory Visit (INDEPENDENT_AMBULATORY_CARE_PROVIDER_SITE_OTHER): Payer: Medicare Other

## 2014-12-15 ENCOUNTER — Ambulatory Visit (INDEPENDENT_AMBULATORY_CARE_PROVIDER_SITE_OTHER): Payer: Medicare Other | Admitting: Emergency Medicine

## 2014-12-15 ENCOUNTER — Encounter: Payer: Self-pay | Admitting: Emergency Medicine

## 2014-12-15 VITALS — BP 138/71 | HR 68 | Temp 97.2°F | Resp 16 | Ht 64.0 in | Wt 187.0 lb

## 2014-12-15 DIAGNOSIS — M109 Gout, unspecified: Secondary | ICD-10-CM | POA: Diagnosis not present

## 2014-12-15 DIAGNOSIS — N8189 Other female genital prolapse: Secondary | ICD-10-CM

## 2014-12-15 DIAGNOSIS — M1000 Idiopathic gout, unspecified site: Secondary | ICD-10-CM | POA: Diagnosis not present

## 2014-12-15 DIAGNOSIS — I10 Essential (primary) hypertension: Secondary | ICD-10-CM

## 2014-12-15 DIAGNOSIS — R7309 Other abnormal glucose: Secondary | ICD-10-CM | POA: Diagnosis not present

## 2014-12-15 DIAGNOSIS — R05 Cough: Secondary | ICD-10-CM | POA: Diagnosis not present

## 2014-12-15 DIAGNOSIS — E119 Type 2 diabetes mellitus without complications: Secondary | ICD-10-CM | POA: Diagnosis not present

## 2014-12-15 DIAGNOSIS — M542 Cervicalgia: Secondary | ICD-10-CM

## 2014-12-15 DIAGNOSIS — E039 Hypothyroidism, unspecified: Secondary | ICD-10-CM | POA: Diagnosis not present

## 2014-12-15 DIAGNOSIS — E785 Hyperlipidemia, unspecified: Secondary | ICD-10-CM | POA: Diagnosis not present

## 2014-12-15 DIAGNOSIS — R059 Cough, unspecified: Secondary | ICD-10-CM

## 2014-12-15 DIAGNOSIS — R7303 Prediabetes: Secondary | ICD-10-CM

## 2014-12-15 LAB — BASIC METABOLIC PANEL WITH GFR
BUN: 24 mg/dL (ref 7–25)
CO2: 25 mmol/L (ref 20–31)
CREATININE: 0.8 mg/dL (ref 0.60–0.93)
Calcium: 9.5 mg/dL (ref 8.6–10.4)
Chloride: 102 mmol/L (ref 98–110)
GFR, Est African American: 82 mL/min (ref >=60)
GFR, Est Non African American: 71 mL/min (ref >=60)
Glucose, Bld: 141 mg/dL — ABNORMAL HIGH (ref 65–99)
Potassium: 4.4 mmol/L (ref 3.5–5.3)
Sodium: 138 mmol/L (ref 135–146)

## 2014-12-15 LAB — POCT GLYCOSYLATED HEMOGLOBIN (HGB A1C): Hemoglobin A1C: 7.2

## 2014-12-15 LAB — LIPID PANEL
Cholesterol: 262 mg/dL — ABNORMAL HIGH (ref 125–200)
HDL: 40 mg/dL — ABNORMAL LOW (ref >=46)
LDL CALC: 161 mg/dL — AB (ref <130)
Total CHOL/HDL Ratio: 6.6 Ratio — ABNORMAL HIGH (ref <=5.0)
Triglycerides: 306 mg/dL — ABNORMAL HIGH (ref <150)
VLDL: 61 mg/dL — AB (ref <30)

## 2014-12-15 LAB — GLUCOSE, POCT (MANUAL RESULT ENTRY): POC Glucose: 138 mg/dl — AB (ref 70–99)

## 2014-12-15 LAB — THYROID PANEL WITH TSH
FREE THYROXINE INDEX: 2.8 (ref 1.4–3.8)
T3 UPTAKE: 27 % (ref 22–35)
T4 TOTAL: 10.4 ug/dL (ref 4.5–12.0)
TSH: 2.943 u[IU]/mL (ref 0.350–4.500)

## 2014-12-15 LAB — URIC ACID: Uric Acid, Serum: 7 mg/dL (ref 2.4–7.0)

## 2014-12-15 LAB — SEDIMENTATION RATE: Sed Rate: 40 mm/hr — ABNORMAL HIGH (ref 0–30)

## 2014-12-15 MED ORDER — METFORMIN HCL 500 MG PO TABS: ORAL_TABLET | ORAL | Status: DC

## 2014-12-15 MED ORDER — ALLOPURINOL 100 MG PO TABS: ORAL_TABLET | ORAL | Status: DC

## 2014-12-15 MED ORDER — LOSARTAN POTASSIUM 50 MG PO TABS: 50.0000 mg | ORAL_TABLET | Freq: Every day | ORAL | Status: DC

## 2014-12-15 MED ORDER — LEVOTHYROXINE SODIUM 125 MCG PO TABS: ORAL_TABLET | ORAL | Status: DC

## 2014-12-15 NOTE — Progress Notes (Signed)
Subjective:  This chart was scribed for Darlyne Russian, MD by Tamsen Roers, at [facility].  This patient was seen in room 21 and the patient's care was started at 10:18 AM.    Patient ID: Olivia Robinson, female    DOB: 08/01/1934, 80 y.o.   MRN: [id number]   Chief Complaint  Patient presents with  . Hypothyroidism  . prediabetes    HPI  HPI Comments: Olivia Robinson is a 80 y.o. female who presents to the [facility] for a follow up.  Patient states that she feels very sore as well as "thick and heavy" in her neck area.  Patient denies any chest pain, shortness of breath, difficulty breathing.  She is compliant with her thyroid medication.  Patient chews nicotine gun and quit smoking 20 years ago. Patient states that her last pneumonia shot was almost 2 years ago.  She does not think she has had Prevnar 13. Patient declined a coloscopy and mammogram. Patient is currently living alone.  Her daughter has graves disease and has been taking care of her as she has had multiple surgeries.    Patient Active Problem List   Diagnosis Date Noted  . Abnormal CT scan, chest 03/21/2012  . Cough 03/20/2012  . Diabetes mellitus 11/27/2011  . CAD (coronary artery disease) 11/27/2011  . SOB (shortness of breath) 11/19/2011  . Abnormal EKG 11/19/2011  . Pelvic relaxation 06/18/2011  . SUI (stress urinary incontinence, female) 06/18/2011  . HYPOTHYROIDISM 04/10/2007  . HYPERLIPIDEMIA 04/10/2007  . HYPERTENSION 04/10/2007  . OSTEOARTHRITIS 04/10/2007   Past Medical History  Diagnosis Date  . Yeast infection   . Bacterial infection   . Prolapsed bladder   . Varicose veins   . H/O bleeding disorder   . Thyroid disease   . Leaking of urine   . Diabetes mellitus   . Allergy   . Anemia   . Hypertension    Past Surgical History  Procedure Laterality Date  . Abdominal hysterectomy    . Ovarian cyst removal    . Tonsillectomy    . Joint  replacement     Allergies  Allergen Reactions  . Ampicillin   . Clindamycin     REACTION: diarrhea  . Nsaids Other (See Comments)    GI Bleed     . Sulfa Antibiotics    Prior to Admission medications   Medication Sig Start Date End Date Taking? Authorizing Provider  allopurinol (ZYLOPRIM) 100 MG tablet One daily 06/14/14   Darlyne Russian, MD  Ascorbic Acid (VITAMIN C) 100 MG tablet Take 1,000 mg by mouth 2 (two) times daily.     Historical Provider, MD  atorvastatin (LIPITOR) 10 MG tablet Take 1 tablet (10 mg total) by mouth daily. Patient not taking: Reported on 06/14/2014 07/21/12   Robyn Haber, MD  benzonatate (TESSALON) 100 MG capsule Take 1-2 capsules (100-200 mg total) by mouth 3 (three) times daily as needed for cough. 06/03/14   Darlyne Russian, MD  cetirizine (ZYRTEC) 10 MG tablet Take 10 mg by mouth daily.    Historical Provider, MD  Cholecalciferol (VITAMIN D PO) Take 1 tablet by mouth daily.    Historical Provider, MD  fluticasone (FLONASE) 50 MCG/ACT nasal spray Place 2 sprays into the nose daily as needed. 02/26/12   Darlyne Russian, MD  levothyroxine (SYNTHROID, LEVOTHROID) 125 MCG tablet One daily 06/14/14   Darlyne Russian, MD  losartan (COZAAR) 50  MG tablet Take 1 tablet (50 mg total) by mouth daily. 06/14/14   Darlyne Russian, MD  metFORMIN (GLUCOPHAGE) 500 MG tablet TAKE ONE TABLET BY MOUTH TWICE DAILY 07/19/12   Collene Leyden, PA-C  metFORMIN (GLUCOPHAGE) 500 MG tablet 2 in the morning and 1 in the evening 06/14/14   Darlyne Russian, MD  Probiotic Product (PROBIOTIC DAILY PO) Take 1 tablet by mouth daily.    Historical Provider, MD   Social History   Social History  . Marital Status: Single    Spouse Name: N/A  . Number of Children: 2  . Years of Education: N/A   Occupational History  . Retired    Social History Main Topics  . Smoking status: Former Smoker -- 1.50 packs/day for 20 years    Types: Cigarettes    Quit date: 04/01/1996  . Smokeless tobacco: Never Used    . Alcohol Use: No  . Drug Use: No  . Sexual Activity: Not Currently   Other Topics Concern  . Not on file   Social History Narrative        Review of Systems  Constitutional: Negative for fever and chills.  Eyes: Negative for pain, discharge, redness and itching.  Respiratory: Negative for choking and shortness of breath.   Cardiovascular: Negative for chest pain.  Gastrointestinal: Negative for nausea and vomiting.  Musculoskeletal: Positive for neck pain.       Objective:   Physical Exam CONSTITUTIONAL: she is alert and cooperative.  HEAD: Normocephalic/atraumatic EYES: EOMI/PERRL ENMT: Mucous membranes moist NECK: supple no meningeal signs SPINE/BACK:tenderness over the cervical spine with limited flexion and extension  CV: no carotid bruits, RRR LUNGS: Few dry rales in both lung bases NEURO: Pt is awake/alert/appropriate, moves all extremitiesx4.  No facial droop.   SKIN: warm, color normal PSYCH: no abnormalities of mood noted, alert and oriented to situation   Filed Vitals:   12/15/14 0943  BP: 138/71  Pulse: 68  Temp: 97.2 F (36.2 C)  TempSrc: Oral  Resp: 16  Height: 5\' 4"  (1.626 m)  Weight: 187 lb (84.823 kg)  [facility] reading (PRIMARY) by  Dr. Everlene Farrier patient has multilevel degenerative disc disease with foraminal narrowing.      Assessment & Plan:  1. Type 2 diabetes mellitus without complication Results for orders placed or performed in visit on 12/15/14  POCT glucose (manual entry)  Result Value Ref Range   POC Glucose 138 (A) 70 - 99 mg/dl  POCT glycosylated hemoglobin (Hb A1C)  Result Value Ref Range   Hemoglobin A1C 7.2   this is improved from her last hemoglobin A1c  - POCT glucose (manual entry) - POCT glycosylated hemoglobin (Hb A1C)  2. Hypothyroidism, unspecified hypothyroidism type  - Thyroid Panel With TSH - levothyroxine (SYNTHROID, LEVOTHROID) 125 MCG tablet; One daily  Dispense: 90 tablet; Refill: 3  3. Essential  hypertension Blood pressure at goal - BASIC METABOLIC PANEL WITH GFR  4. Cough No longer a problem with change in medication 5. Pelvic relaxation disorder Stable  6. Idiopathic gout, unspecified chronicity, unspecified site  - Uric acid  7. Neck pain, bilateral Patient has multilevel degenerative disc disease with foraminal narrowing. Will treat with alternating ice and heat packs and Tylenol for pain. - Sedimentation rate - DG Cervical Spine Complete; Future       I personally performed the services described in this documentation, which was scribed in my presence. The recorded information has been reviewed and is accurate.  Arlyss Queen,  MD  [facility]  12/15/2014 2:16 PM

## 2014-12-20 NOTE — Addendum Note (Signed)
Addended by: Constance Goltz on: 12/20/2014 11:18 AM   Modules accepted: Orders

## 2015-01-03 ENCOUNTER — Encounter: Payer: Self-pay | Admitting: Emergency Medicine

## 2015-01-09 DIAGNOSIS — L821 Other seborrheic keratosis: Secondary | ICD-10-CM | POA: Diagnosis not present

## 2015-01-09 DIAGNOSIS — D225 Melanocytic nevi of trunk: Secondary | ICD-10-CM | POA: Diagnosis not present

## 2015-01-09 DIAGNOSIS — L218 Other seborrheic dermatitis: Secondary | ICD-10-CM | POA: Diagnosis not present

## 2015-01-09 DIAGNOSIS — L720 Epidermal cyst: Secondary | ICD-10-CM | POA: Diagnosis not present

## 2015-01-09 DIAGNOSIS — L738 Other specified follicular disorders: Secondary | ICD-10-CM | POA: Diagnosis not present

## 2015-05-01 DIAGNOSIS — H25011 Cortical age-related cataract, right eye: Secondary | ICD-10-CM | POA: Diagnosis not present

## 2015-05-01 DIAGNOSIS — H04123 Dry eye syndrome of bilateral lacrimal glands: Secondary | ICD-10-CM | POA: Diagnosis not present

## 2015-05-01 DIAGNOSIS — Z961 Presence of intraocular lens: Secondary | ICD-10-CM | POA: Diagnosis not present

## 2015-05-01 DIAGNOSIS — H2511 Age-related nuclear cataract, right eye: Secondary | ICD-10-CM | POA: Diagnosis not present

## 2015-05-01 DIAGNOSIS — H5712 Ocular pain, left eye: Secondary | ICD-10-CM | POA: Diagnosis not present

## 2015-05-01 DIAGNOSIS — C8371 Burkitt lymphoma, lymph nodes of head, face, and neck: Secondary | ICD-10-CM | POA: Diagnosis not present

## 2015-05-01 DIAGNOSIS — H1789 Other corneal scars and opacities: Secondary | ICD-10-CM | POA: Diagnosis not present

## 2015-05-01 DIAGNOSIS — S0502XA Injury of conjunctiva and corneal abrasion without foreign body, left eye, initial encounter: Secondary | ICD-10-CM | POA: Diagnosis not present

## 2015-05-05 DIAGNOSIS — H01001 Unspecified blepharitis right upper eyelid: Secondary | ICD-10-CM | POA: Diagnosis not present

## 2015-06-02 DIAGNOSIS — H01001 Unspecified blepharitis right upper eyelid: Secondary | ICD-10-CM | POA: Diagnosis not present

## 2015-07-07 DIAGNOSIS — H0289 Other specified disorders of eyelid: Secondary | ICD-10-CM | POA: Diagnosis not present

## 2015-07-07 DIAGNOSIS — H04123 Dry eye syndrome of bilateral lacrimal glands: Secondary | ICD-10-CM | POA: Diagnosis not present

## 2015-07-07 DIAGNOSIS — H1013 Acute atopic conjunctivitis, bilateral: Secondary | ICD-10-CM | POA: Diagnosis not present

## 2015-07-07 DIAGNOSIS — H01001 Unspecified blepharitis right upper eyelid: Secondary | ICD-10-CM | POA: Diagnosis not present

## 2015-07-31 DIAGNOSIS — H25041 Posterior subcapsular polar age-related cataract, right eye: Secondary | ICD-10-CM | POA: Diagnosis not present

## 2015-07-31 DIAGNOSIS — I10 Essential (primary) hypertension: Secondary | ICD-10-CM | POA: Diagnosis not present

## 2015-07-31 DIAGNOSIS — H16223 Keratoconjunctivitis sicca, not specified as Sjogren's, bilateral: Secondary | ICD-10-CM | POA: Diagnosis not present

## 2015-07-31 DIAGNOSIS — Z961 Presence of intraocular lens: Secondary | ICD-10-CM | POA: Diagnosis not present

## 2015-11-20 DIAGNOSIS — H18453 Nodular corneal degeneration, bilateral: Secondary | ICD-10-CM | POA: Diagnosis not present

## 2015-11-20 DIAGNOSIS — H35033 Hypertensive retinopathy, bilateral: Secondary | ICD-10-CM | POA: Diagnosis not present

## 2015-11-20 DIAGNOSIS — H43811 Vitreous degeneration, right eye: Secondary | ICD-10-CM | POA: Diagnosis not present

## 2015-11-20 DIAGNOSIS — H16223 Keratoconjunctivitis sicca, not specified as Sjogren's, bilateral: Secondary | ICD-10-CM | POA: Diagnosis not present

## 2015-11-20 DIAGNOSIS — H1850 Unspecified hereditary corneal dystrophies: Secondary | ICD-10-CM | POA: Diagnosis not present

## 2015-11-20 DIAGNOSIS — H04123 Dry eye syndrome of bilateral lacrimal glands: Secondary | ICD-10-CM | POA: Diagnosis not present

## 2015-11-20 DIAGNOSIS — H52223 Regular astigmatism, bilateral: Secondary | ICD-10-CM | POA: Diagnosis not present

## 2015-11-20 DIAGNOSIS — H5203 Hypermetropia, bilateral: Secondary | ICD-10-CM | POA: Diagnosis not present

## 2015-11-20 DIAGNOSIS — H40012 Open angle with borderline findings, low risk, left eye: Secondary | ICD-10-CM | POA: Diagnosis not present

## 2015-11-20 DIAGNOSIS — H40031 Anatomical narrow angle, right eye: Secondary | ICD-10-CM | POA: Diagnosis not present

## 2015-11-20 DIAGNOSIS — H18413 Arcus senilis, bilateral: Secondary | ICD-10-CM | POA: Diagnosis not present

## 2015-11-20 DIAGNOSIS — Z7984 Long term (current) use of oral hypoglycemic drugs: Secondary | ICD-10-CM | POA: Diagnosis not present

## 2015-12-17 ENCOUNTER — Other Ambulatory Visit: Payer: Self-pay | Admitting: Emergency Medicine

## 2015-12-17 DIAGNOSIS — I10 Essential (primary) hypertension: Secondary | ICD-10-CM

## 2015-12-23 DIAGNOSIS — Z23 Encounter for immunization: Secondary | ICD-10-CM | POA: Diagnosis not present

## 2016-01-10 ENCOUNTER — Ambulatory Visit (INDEPENDENT_AMBULATORY_CARE_PROVIDER_SITE_OTHER): Payer: Medicare Other | Admitting: Family Medicine

## 2016-01-10 VITALS — BP 140/72 | HR 71 | Temp 98.1°F | Resp 16 | Ht 64.0 in | Wt 189.8 lb

## 2016-01-10 DIAGNOSIS — M109 Gout, unspecified: Secondary | ICD-10-CM | POA: Insufficient documentation

## 2016-01-10 DIAGNOSIS — E039 Hypothyroidism, unspecified: Secondary | ICD-10-CM

## 2016-01-10 DIAGNOSIS — E119 Type 2 diabetes mellitus without complications: Secondary | ICD-10-CM | POA: Diagnosis not present

## 2016-01-10 DIAGNOSIS — E785 Hyperlipidemia, unspecified: Secondary | ICD-10-CM

## 2016-01-10 DIAGNOSIS — M1A9XX0 Chronic gout, unspecified, without tophus (tophi): Secondary | ICD-10-CM | POA: Diagnosis not present

## 2016-01-10 DIAGNOSIS — I10 Essential (primary) hypertension: Secondary | ICD-10-CM

## 2016-01-10 DIAGNOSIS — M1000 Idiopathic gout, unspecified site: Secondary | ICD-10-CM

## 2016-01-10 LAB — POCT GLYCOSYLATED HEMOGLOBIN (HGB A1C): HEMOGLOBIN A1C: 8.3

## 2016-01-10 MED ORDER — METFORMIN HCL 1000 MG PO TABS: 1000.0000 mg | ORAL_TABLET | Freq: Two times a day (BID) | ORAL | 3 refills | Status: DC

## 2016-01-10 MED ORDER — LOSARTAN POTASSIUM 50 MG PO TABS: 50.0000 mg | ORAL_TABLET | Freq: Every day | ORAL | 0 refills | Status: DC

## 2016-01-10 MED ORDER — METFORMIN HCL 500 MG PO TABS: 500.0000 mg | ORAL_TABLET | Freq: Two times a day (BID) | ORAL | 3 refills | Status: DC

## 2016-01-10 MED ORDER — LEVOTHYROXINE SODIUM 125 MCG PO TABS: ORAL_TABLET | ORAL | 3 refills | Status: DC

## 2016-01-10 MED ORDER — ALLOPURINOL 100 MG PO TABS: ORAL_TABLET | ORAL | 3 refills | Status: DC

## 2016-01-10 NOTE — Assessment & Plan Note (Signed)
Well-controlled with daily allopurinol. Only one flare recently that resolved without further medication.  - Continue allopurinol

## 2016-01-10 NOTE — Assessment & Plan Note (Signed)
Controlled given patient's age at 140/72. No recent HA or changes in vision. No cough on ARB.  - Continue losartan 50mg  qd - Provided coupon so losartan will cost only $10 at [facility] since patient does not have Medicare prescription coverage

## 2016-01-10 NOTE — Assessment & Plan Note (Signed)
Controlled. No complaints today.  - Continue Synthroid 125 mcg qd

## 2016-01-10 NOTE — Assessment & Plan Note (Signed)
Poorly controlled. A1C increased to 8.3 today. No signs of neuro damage. Patient agreeable to increasing metformin.  - Increase metformin to 1000mg  BID

## 2016-01-10 NOTE — Assessment & Plan Note (Signed)
Patient non-compliant with atorvastatin. Says she does not wish to take this medication as she is afraid it will damage her liver based on a commercial she heard. Explained that the risk of negative effects of high cholesterol on her body is greater than the risk of medication side effects with appropriate monitoring, however patient still refusing to take medication.

## 2016-01-10 NOTE — Progress Notes (Signed)
   Subjective:    Patient ID: Olivia Robinson, female    DOB: 06/28/1934, 81 y.o.   MRN: [id number]  HPI  Type II DM Does not check blood sugar at home. Has seen her ophthalmologist multiple times this year because her eyes have been dry. Currently taking metformin 500mg  BID. Denies numbness or tingling in extremities  HTN Does not check blood pressure at home. Denies recent headaches or changes in vision. Has been taking losartan daily, but is having difficulty affording medication, as she says it costs $60 without insurance.   Gout Recent flare 2-3 weeks ago which resolved reportedly with a "black cherry supplement." Occurred after she ate a rare steak. Reports that she tries to eat minimal red meat, limiting herself to 3-4 ounces. Taking allopurinol daily.   HLD Not taking atorvastatin as she is concerned about side effects. Has never tried but saw a commercial about potential effects of statins on the liver and is worried about taking it.   Hypothyroidism No complaints today. Taking Synthroid daily as prescribed.   Review of Systems See HPI.     Objective:   Physical Exam  Constitutional: She is oriented to person, place, and time. She appears well-developed and well-nourished.  HENT:  Head: Normocephalic and atraumatic.  Right Ear: External ear normal.  Left Ear: External ear normal.  Eyes: Conjunctivae and EOM are normal. Right eye exhibits no discharge. Left eye exhibits no discharge.  Pulmonary/Chest: Effort normal. No respiratory distress.  Musculoskeletal: Normal range of motion. She exhibits no edema.  Neurological: She is alert and oriented to person, place, and time.  Skin: Skin is warm.  Dry skin on back  Psychiatric: She has a normal mood and affect. Her behavior is normal.      Assessment & Plan:  Essential hypertension Controlled given patient's age at 140/72. No recent HA or changes in vision. No cough on ARB.  - Continue losartan 50mg  qd - Provided  coupon so losartan will cost only $10 at [facility] since patient does not have Medicare prescription coverage  Hypothyroidism Controlled. No complaints today.  - Continue Synthroid 125 mcg qd  Diabetes mellitus Poorly controlled. A1C increased to 8.3 today. No signs of neuro damage. Patient agreeable to increasing metformin.  - Increase metformin to 1000mg  BID   Hyperlipidemia Patient non-compliant with atorvastatin. Says she does not wish to take this medication as she is afraid it will damage her liver based on a commercial she heard. Explained that the risk of negative effects of high cholesterol on her body is greater than the risk of medication side effects with appropriate monitoring, however patient still refusing to take medication.   Gout Well-controlled with daily allopurinol. Only one flare recently that resolved without further medication.  - Continue allopurinol  Health Maintenance Patient has never had colonoscopy or mammogram. Refuses again today despite explaining benefits of screening.   Adin Hector, MD, MPH PGY-2 [facility] Pager [phone]

## 2016-01-10 NOTE — Patient Instructions (Addendum)
It was nice meeting you today Ms. Prinzo!  Please continue to take your medications as you have been.   Below you will find the link to a coupon for losartan. It should only cost $10 at [facility] even without insurance if you use this coupon: https://www.goodrx.com/coupon/[email or web address]  We will see you back in one year.  If you have any questions or concerns, please feel free to call the clinic.   Be well,  Dr. Avon Gully  IF you received an x-ray today, you will receive an invoice from [facility]. Please contact [facility] at [phone] with questions or concerns regarding your invoice.   IF you received labwork today, you will receive an invoice from [employer]. Please contact [facility] at [phone] with questions or concerns regarding your invoice.   Our billing staff will not be able to assist you with questions regarding bills from these companies.  You will be contacted with the lab results as soon as they are available. The fastest way to get your results is to activate your My Chart account. Instructions are located on the last page of this paperwork. If you have not heard from [place] regarding the results in 2 weeks, please contact this office.

## 2016-01-29 ENCOUNTER — Other Ambulatory Visit: Payer: Self-pay | Admitting: Emergency Medicine

## 2016-01-29 DIAGNOSIS — I10 Essential (primary) hypertension: Secondary | ICD-10-CM

## 2016-08-19 ENCOUNTER — Other Ambulatory Visit: Payer: Self-pay | Admitting: Urgent Care

## 2016-08-19 DIAGNOSIS — I10 Essential (primary) hypertension: Secondary | ICD-10-CM

## 2016-08-19 NOTE — Telephone Encounter (Signed)
[facility] CALLING FOR PT TO GET A REFILL ON LASARTAN

## 2016-10-03 ENCOUNTER — Ambulatory Visit (INDEPENDENT_AMBULATORY_CARE_PROVIDER_SITE_OTHER): Payer: Medicare Other | Admitting: Physician Assistant

## 2016-10-03 ENCOUNTER — Encounter: Payer: Self-pay | Admitting: Physician Assistant

## 2016-10-03 VITALS — BP 130/72 | HR 86 | Temp 98.4°F | Resp 16 | Ht 64.0 in | Wt 182.0 lb

## 2016-10-03 DIAGNOSIS — E890 Postprocedural hypothyroidism: Secondary | ICD-10-CM

## 2016-10-03 DIAGNOSIS — M1000 Idiopathic gout, unspecified site: Secondary | ICD-10-CM | POA: Diagnosis not present

## 2016-10-03 DIAGNOSIS — E119 Type 2 diabetes mellitus without complications: Secondary | ICD-10-CM

## 2016-10-03 DIAGNOSIS — I10 Essential (primary) hypertension: Secondary | ICD-10-CM | POA: Diagnosis not present

## 2016-10-03 MED ORDER — LEVOTHYROXINE SODIUM 125 MCG PO TABS: ORAL_TABLET | ORAL | 3 refills | Status: AC

## 2016-10-03 MED ORDER — LISINOPRIL 20 MG PO TABS
20.0000 mg | ORAL_TABLET | Freq: Every day | ORAL | 0 refills | Status: DC
Start: 2016-10-03 — End: 2016-10-31

## 2016-10-03 MED ORDER — ALLOPURINOL 100 MG PO TABS: ORAL_TABLET | ORAL | 3 refills | Status: AC

## 2016-10-03 MED ORDER — METFORMIN HCL 1000 MG PO TABS
1000.0000 mg | ORAL_TABLET | Freq: Two times a day (BID) | ORAL | 3 refills | Status: AC
Start: 2016-10-03 — End: ?

## 2016-10-03 NOTE — Progress Notes (Signed)
PRIMARY CARE AT [facility] [address], [place] [address] [phone]  Date:  10/03/2016   Name:  Olivia Robinson   DOB:  05-04-1934   MRN:  [id number]  PCP:  Patient, No Pcp Per    History of Present Illness:  Olivia Robinson is a 82 y.o. female patient who presents to PCP with  Chief Complaint  Patient presents with  . Medication Refill    Allopurinol, levothyroxine, metformin  . Medication Problem    Pt would like change her BP med - states losartan is too expensive      DM2: No dysuria, tremulousness, polyuria, or polydipsia out of the normal.  No nubmness or tingling or change in vision.  Frequency due to her bladder prolapse.  She has not followed up with urology in years.    Thyroid: she is taking it compliantly taking her medicine.  No side effect to her medication.  No cp, palpitiations, diarrhea, or constipation.  No palpitations, or change in skin.   Gout: may have 3 gouty flares.  Will eat shellfish on occasion and redmeat.  She takes her allopurinol but may add an extra tablet if she is eating poorly.    Patient Active Problem List   Diagnosis Date Noted  . Gout 01/10/2016  . Abnormal CT scan, chest 03/21/2012  . Cough 03/20/2012  . Diabetes mellitus ([place]) 11/27/2011  . CAD (coronary artery disease) 11/27/2011  . SOB (shortness of breath) 11/19/2011  . Abnormal EKG 11/19/2011  . Pelvic relaxation 06/18/2011  . SUI (stress urinary incontinence, female) 06/18/2011  . Hypothyroidism 04/10/2007  . Hyperlipidemia 04/10/2007  . Essential hypertension 04/10/2007  . OSTEOARTHRITIS 04/10/2007    Past Medical History:  Diagnosis Date  . Allergy   . Anemia   . Bacterial infection   . Diabetes mellitus   . H/O bleeding disorder   . Hypertension   . Leaking of urine   . Prolapsed bladder   . Thyroid disease   . Varicose veins   . Yeast infection     Past Surgical History:  Procedure Laterality Date  . ABDOMINAL HYSTERECTOMY    . JOINT REPLACEMENT    .  OVARIAN CYST REMOVAL    . TONSILLECTOMY      Social History  Substance Use Topics  . Smoking status: Former Smoker    Packs/day: 1.50    Years: 20.00    Types: Cigarettes    Quit date: 04/01/1996  . Smokeless tobacco: Never Used  . Alcohol use No    Family History  Problem Relation Age of Onset  . Heart disease Mother   . Stroke Mother   . Heart disease Father   . Stroke Father   . Heart disease Sister   . Stroke Sister     Allergies  Allergen Reactions  . Ampicillin   . Clindamycin     REACTION: diarrhea  . Nsaids Other (See Comments)    GI Bleed     . Sulfa Antibiotics     Medication list has been reviewed and updated.  Current Outpatient Prescriptions on File Prior to Visit  Medication Sig Dispense Refill  . allopurinol (ZYLOPRIM) 100 MG tablet One daily 90 tablet 3  . Ascorbic Acid (VITAMIN C) 100 MG tablet Take 1,000 mg by mouth 2 (two) times daily.     . cetirizine (ZYRTEC) 10 MG tablet Take 10 mg by mouth daily.    Marland Kitchen CRANBERRY FRUIT PO Take by mouth.    Marland Kitchen  levothyroxine (SYNTHROID, LEVOTHROID) 125 MCG tablet One daily 90 tablet 3  . losartan (COZAAR) 50 MG tablet Take 1 tablet (50 mg total) by mouth daily. 30 tablet 0  . metFORMIN (GLUCOPHAGE) 1000 MG tablet Take 1 tablet (1,000 mg total) by mouth 2 (two) times daily with a meal. 180 tablet 3  . OVER THE COUNTER MEDICATION     . Probiotic Product (PROBIOTIC DAILY PO) Take 1 tablet by mouth daily.    . Cholecalciferol (VITAMIN D PO) Take 1 tablet by mouth daily.    . fluticasone (FLONASE) 50 MCG/ACT nasal spray Place 2 sprays into the nose daily as needed.    Marland Kitchen losartan (COZAAR) 50 MG tablet TAKE ONE TABLET BY MOUTH ONCE DAILY (Patient not taking: Reported on 10/03/2016) 30 tablet 0   No current facility-administered medications on file prior to visit.     ROS ROS otherwise unremarkable unless listed above.  Physical Examination: BP (!) 147/74   Pulse 86   Temp 98.4 F (36.9 C) (Oral)   Resp 16   Ht  _0  (1.626 m)   Wt 182 lb (82.6 kg)   SpO2 96%   BMI 31.24 kg/m  Ideal Body Weight: Weight in (lb) to have BMI = 25: 145.3  Physical Exam  Constitutional: She is oriented to person, place, and time. She appears well-developed and well-nourished. No distress.  HENT:  Head: Normocephalic and atraumatic.  Right Ear: External ear normal.  Left Ear: External ear normal.  Eyes: Conjunctivae and EOM are normal. Pupils are equal, round, and reactive to light.  Cardiovascular: Normal rate.   Pulmonary/Chest: Effort normal. No respiratory distress.  Feet:  Right Foot:  Protective Sensation: 6 sites tested. 6 sites sensed.  Skin Integrity: Negative for ulcer, blister or skin breakdown.  Left Foot:  Protective Sensation: 6 sites tested.  Skin Integrity: Negative for ulcer, blister or skin breakdown.  Neurological: She is alert and oriented to person, place, and time.  Skin: She is not diaphoretic.  Psychiatric: She has a normal mood and affect. Her behavior is normal.     Assessment and Plan: Olivia Robinson is a 82 y.o. female who is here today for medication refill. Refilling stable, but may change pending labs of her dm. She will contact if she would like referral to urology Return for microalbumin Changing to lisinopril at patient request due to cost.  She states that she has cough in the past.  Warned that this will likely be the case again.  She will contact if this reoccurs. Type 2 diabetes mellitus without complication, without long-term current use of insulin ([place]) - Plan: metFORMIN (GLUCOPHAGE) 1000 MG tablet, Hemoglobin A1c, Microalbumin, urine, POCT urinalysis dipstick, HM Diabetes Foot Exam, CANCELED: POCT urinalysis dipstick  Idiopathic gout, unspecified chronicity, unspecified site - Plan: allopurinol (ZYLOPRIM) 100 MG tablet  Postablative hypothyroidism - Plan: levothyroxine (SYNTHROID, LEVOTHROID) 125 MCG tablet, TSH  Essential hypertension - Plan: lisinopril  (PRINIVIL,ZESTRIL) 20 MG tablet, CMP14+EGFR  Ivar Drape, PA-C [facility] 7/8/20187:38 AM

## 2016-10-03 NOTE — Patient Instructions (Addendum)
Diabetes Mellitus and Food It is important for you to manage your blood sugar (glucose) level. Your blood glucose level can be greatly affected by what you eat. Eating healthier foods in the appropriate amounts throughout the day at about the same time each day will help you control your blood glucose level. It can also help slow or prevent worsening of your diabetes mellitus. Healthy eating may even help you improve the level of your blood pressure and reach or maintain a healthy weight. General recommendations for healthful eating and cooking habits include:  Eating meals and snacks regularly. Avoid going long periods of time without eating to lose weight.  Eating a diet that consists mainly of plant-based foods, such as fruits, vegetables, nuts, legumes, and whole grains.  Using low-heat cooking methods, such as baking, instead of high-heat cooking methods, such as deep frying.  Work with your dietitian to make sure you understand how to use the Nutrition Facts information on food labels. How can food affect me? Carbohydrates Carbohydrates affect your blood glucose level more than any other type of food. Your dietitian will help you determine how many carbohydrates to eat at each meal and teach you how to count carbohydrates. Counting carbohydrates is important to keep your blood glucose at a healthy level, especially if you are using insulin or taking certain medicines for diabetes mellitus. Alcohol Alcohol can cause sudden decreases in blood glucose (hypoglycemia), especially if you use insulin or take certain medicines for diabetes mellitus. Hypoglycemia can be a life-threatening condition. Symptoms of hypoglycemia (sleepiness, dizziness, and disorientation) are similar to symptoms of having too much alcohol. If your health care provider has given you approval to drink alcohol, do so in moderation and use the following guidelines:  Women should not have more than one drink per day, and men  should not have more than two drinks per day. One drink is equal to: ? 12 oz of beer. ? 5 oz of wine. ? 1 oz of hard liquor.  Do not drink on an empty stomach.  Keep yourself hydrated. Have water, diet soda, or unsweetened iced tea.  Regular soda, juice, and other mixers might contain a lot of carbohydrates and should be counted.  What foods are not recommended? As you make food choices, it is important to remember that all foods are not the same. Some foods have fewer nutrients per serving than other foods, even though they might have the same number of calories or carbohydrates. It is difficult to get your body what it needs when you eat foods with fewer nutrients. Examples of foods that you should avoid that are high in calories and carbohydrates but low in nutrients include:  Trans fats (most processed foods list trans fats on the Nutrition Facts label).  Regular soda.  Juice.  Candy.  Sweets, such as cake, pie, doughnuts, and cookies.  Fried foods.  What foods can I eat? Eat nutrient-rich foods, which will nourish your body and keep you healthy. The food you should eat also will depend on several factors, including:  The calories you need.  The medicines you take.  Your weight.  Your blood glucose level.  Your blood pressure level.  Your cholesterol level.  You should eat a variety of foods, including:  Protein. ? Lean cuts of meat. ? Proteins low in saturated fats, such as fish, egg whites, and beans. Avoid processed meats.  Fruits and vegetables. ? Fruits and vegetables that may help control blood glucose levels, such as apples,   mangoes, and yams.  Dairy products. ? Choose fat-free or low-fat dairy products, such as milk, yogurt, and cheese.  Grains, bread, pasta, and rice. ? Choose whole grain products, such as multigrain bread, whole oats, and brown rice. These foods may help control blood pressure.  Fats. ? Foods containing healthful fats, such as  nuts, avocado, olive oil, canola oil, and fish.  Does everyone with diabetes mellitus have the same meal plan? Because every person with diabetes mellitus is different, there is not one meal plan that works for everyone. It is very important that you meet with a dietitian who will help you create a meal plan that is just right for you. This information is not intended to replace advice given to you by your health care provider. Make sure you discuss any questions you have with your health care provider. Document Released: 12/13/2004 Document Revised: 08/24/2015 Document Reviewed: 02/12/2013 Elsevier Interactive Patient Education  2017 Elsevier Inc.    IF you received an x-ray today, you will receive an invoice from [facility]. Please contact [facility] at [phone] with questions or concerns regarding your invoice.   IF you received labwork today, you will receive an invoice from [facility]. Please contact [facility] at [phone] with questions or concerns regarding your invoice.   Our billing staff will not be able to assist you with questions regarding bills from these companies.  You will be contacted with the lab results as soon as they are available. The fastest way to get your results is to activate your My Chart account. Instructions are located on the last page of this paperwork. If you have not heard from us regarding the results in 2 weeks, please contact this office.      

## 2016-10-04 LAB — CMP14+EGFR
A/G RATIO: 1.7 (ref 1.2–2.2)
ALT: 34 IU/L — AB (ref 0–32)
AST: 27 IU/L (ref 0–40)
Albumin: 4.3 g/dL (ref 3.5–4.7)
Alkaline Phosphatase: 142 IU/L — ABNORMAL HIGH (ref 39–117)
BUN/Creatinine Ratio: 27 (ref 12–28)
BUN: 19 mg/dL (ref 8–27)
Bilirubin Total: 0.5 mg/dL (ref 0.0–1.2)
CHLORIDE: 102 mmol/L (ref 96–106)
CO2: 21 mmol/L (ref 20–29)
Calcium: 10 mg/dL (ref 8.7–10.3)
Creatinine, Ser: 0.71 mg/dL (ref 0.57–1.00)
GFR, EST AFRICAN AMERICAN: 93 mL/min/{1.73_m2} (ref >59)
GFR, EST NON AFRICAN AMERICAN: 81 mL/min/{1.73_m2} (ref >59)
GLUCOSE: 131 mg/dL — AB (ref 65–99)
Globulin, Total: 2.5 g/dL (ref 1.5–4.5)
POTASSIUM: 4.6 mmol/L (ref 3.5–5.2)
Sodium: 141 mmol/L (ref 134–144)
Total Protein: 6.8 g/dL (ref 6.0–8.5)

## 2016-10-04 LAB — HEMOGLOBIN A1C
ESTIMATED AVERAGE GLUCOSE: 163 mg/dL
HEMOGLOBIN A1C: 7.3 % — AB (ref 4.8–5.6)

## 2016-10-04 LAB — TSH: TSH: 1.41 u[IU]/mL (ref 0.450–4.500)

## 2016-10-08 ENCOUNTER — Other Ambulatory Visit (INDEPENDENT_AMBULATORY_CARE_PROVIDER_SITE_OTHER): Payer: Self-pay | Admitting: Physical Medicine and Rehabilitation

## 2016-10-08 ENCOUNTER — Ambulatory Visit (INDEPENDENT_AMBULATORY_CARE_PROVIDER_SITE_OTHER): Payer: Self-pay

## 2016-10-08 ENCOUNTER — Ambulatory Visit (INDEPENDENT_AMBULATORY_CARE_PROVIDER_SITE_OTHER): Payer: Medicare Other | Admitting: Orthopaedic Surgery

## 2016-10-08 ENCOUNTER — Ambulatory Visit (INDEPENDENT_AMBULATORY_CARE_PROVIDER_SITE_OTHER): Payer: Medicare Other

## 2016-10-08 DIAGNOSIS — M25551 Pain in right hip: Secondary | ICD-10-CM

## 2016-10-08 MED ORDER — BUPIVACAINE HCL 0.5 % IJ SOLN
3.0000 mL | INTRAMUSCULAR | Status: AC | PRN
  Administered 2016-10-08: 3 mL via INTRA_ARTICULAR

## 2016-10-08 MED ORDER — TRIAMCINOLONE ACETONIDE 40 MG/ML IJ SUSP
80.0000 mg | INTRAMUSCULAR | Status: AC | PRN
  Administered 2016-10-08: 80 mg via INTRA_ARTICULAR

## 2016-10-08 NOTE — Progress Notes (Signed)
Office Visit Note   Patient: Olivia Robinson           Date of Birth: [birth year]           MRN: [id number] Visit Date: 10/08/2016              Requested by: No referring provider defined for this encounter. PCP: Patient, No Pcp Per   Assessment & Plan: Visit Diagnoses:  1. Pain in right hip     Plan: Right hip cortisone injection order for Dr. Ernestina Patches today. Follow-up with me as needed. patient would like to avoid a total hip replacement if possible.  Follow-Up Instructions: Return if symptoms worsen or fail to improve.   Orders:  Orders Placed This Encounter  Procedures  . XR HIP UNILAT W OR W/O PELVIS 2-3 VIEWS RIGHT   No orders of the defined types were placed in this encounter.     Procedures: No procedures performed   Clinical Data: No additional findings.   Subjective: Chief Complaint  Patient presents with  . Right Hip - Pain    Patient is a 82 year old female comes in with right hip pain. She status post left total hip replacement several years ago by Dr. Rush Farmer and she has done well from this. She is comes in today requesting a right hip injection. She endorses pain in her right hip without radiation. Denies any back pain.    Review of Systems  Constitutional: Negative.   HENT: Negative.   Eyes: Negative.   Respiratory: Negative.   Cardiovascular: Negative.   Endocrine: Negative.   Musculoskeletal: Negative.   Neurological: Negative.   Hematological: Negative.   Psychiatric/Behavioral: Negative.   All other systems reviewed and are negative.    Objective: Vital Signs: There were no vitals taken for this visit.  Physical Exam  Constitutional: She is oriented to person, place, and time. She appears well-developed and well-nourished.  HENT:  Head: Normocephalic and atraumatic.  Eyes: EOM are normal.  Neck: Neck supple.  Pulmonary/Chest: Effort normal.  Abdominal: Soft.  Neurological: She is alert and oriented to person, place, and  time.  Skin: Skin is warm. Capillary refill takes less than 2 seconds.  Psychiatric: She has a normal mood and affect. Her behavior is normal. Judgment and thought content normal.  Nursing note and vitals reviewed.   Ortho Exam Right hip exam shows limited internal and external rotation with positive Stinchfield sign. Lateral hip is nontender. Specialty Comments:  No specialty comments available.  Imaging: Xr Hip Unilat W Or W/o Pelvis 2-3 Views Right  Result Date: 10/08/2016 Advanced right hip degenerative joint disease. Stable left total hip replacement in good alignment.    PMFS History: Patient Active Problem List   Diagnosis Date Noted  . Gout 01/10/2016  . Abnormal CT scan, chest 03/21/2012  . Cough 03/20/2012  . Diabetes mellitus ([place]) 11/27/2011  . CAD (coronary artery disease) 11/27/2011  . SOB (shortness of breath) 11/19/2011  . Abnormal EKG 11/19/2011  . Pelvic relaxation 06/18/2011  . SUI (stress urinary incontinence, female) 06/18/2011  . Hypothyroidism 04/10/2007  . Hyperlipidemia 04/10/2007  . Essential hypertension 04/10/2007  . OSTEOARTHRITIS 04/10/2007   Past Medical History:  Diagnosis Date  . Allergy   . Anemia   . Bacterial infection   . Diabetes mellitus   . H/O bleeding disorder   . Hypertension   . Leaking of urine   . Prolapsed bladder   . Thyroid disease   . Varicose veins   .  Yeast infection     Family History  Problem Relation Age of Onset  . Heart disease Mother   . Stroke Mother   . Heart disease Father   . Stroke Father   . Heart disease Sister   . Stroke Sister     Past Surgical History:  Procedure Laterality Date  . ABDOMINAL HYSTERECTOMY    . JOINT REPLACEMENT    . OVARIAN CYST REMOVAL    . TONSILLECTOMY     Social History   Occupational History  . Retired Retired   Social History Main Topics  . Smoking status: Former Smoker    Packs/day: 1.50    Years: 20.00    Types: Cigarettes    Quit date: 04/01/1996  .  Smokeless tobacco: Never Used  . Alcohol use No  . Drug use: No  . Sexual activity: Not Currently

## 2016-10-08 NOTE — Progress Notes (Addendum)
LEXANDRA Robinson - 82 y.o. female MRN [id number]  Date of birth: 1934-12-19  Office Visit Note: Visit Date: 10/08/2016 PCP: Patient, No Pcp Per Referred by: No ref. provider found  Subjective: Chief Complaint  Patient presents with  . Right Hip - Pain   HPI: [name] is an 82 yr old female with history of left THA and now several months of right hip and groin pain wihtout trauma. She saw Dr. Erlinda Hong today who requests intra-articular injection.     ROS Otherwise per HPI.  Assessment & Plan: Visit Diagnoses:  1. Pain in right hip     Plan: Findings:  Diagnostic and therapeutic right hip intra-articular injection with fluoroscopic guidance. Patient did get relief with injection.    Meds & Orders:  Meds ordered this encounter  Medications  . bupivacaine (MARCAINE) 0.5 % (with pres) injection 3 mL  . triamcinolone acetonide (KENALOG-40) injection 80 mg    Orders Placed This Encounter  Procedures  . Large Joint Injection/Arthrocentesis  . XR HIP UNILAT W OR W/O PELVIS 2-3 VIEWS RIGHT  . XR C-ARM NO REPORT    Follow-up: Return if symptoms worsen or fail to improve.   Procedures: Large Joint Inj Date/Time: 10/08/2016 5:17 PM Performed by: Magnus Sinning Authorized by: Magnus Sinning   Consent Given by:  Patient Site marked: the procedure site was marked   Timeout: prior to procedure the correct patient, procedure, and site was verified   Indications:  Pain and diagnostic evaluation Location:  Hip Site:  R hip joint Prep: patient was prepped and draped in usual sterile fashion   Needle Size:  22 G Needle Length:  3.5 inches Approach:  Anterior Ultrasound Guidance: No   Fluoroscopic Guidance: Yes   Arthrogram: No   Medications:  80 mg triamcinolone acetonide 40 MG/ML; 3 mL bupivacaine 0.5 % Aspiration Attempted: Yes   Patient tolerance:  Patient tolerated the procedure well with no immediate complications  There was excellent flow of contrast producing a partial  arthrogram of the hip. The patient did have relief of symptoms during the anesthetic phase of the injection.    No notes on file   Clinical History: No specialty comments available.  She reports that she quit smoking about 20 years ago. Her smoking use included Cigarettes. She has a 30.00 pack-year smoking history. She has never used smokeless tobacco.   Recent Labs  01/10/16 1145 10/03/16 1130  HGBA1C 8.3 7.3*    Objective:  VS:  HT:    WT:   BMI:     BP:   HR: bpm  TEMP: ( )  RESP:  Physical Exam  Musculoskeletal:  Painful range of motion with right hip. Good strength.    Ortho Exam Imaging: Xr C-arm No Report  Result Date: 10/08/2016 Please see Notes or Procedures tab for imaging impression.  Xr Hip Unilat W Or W/o Pelvis 2-3 Views Right  Result Date: 10/08/2016 Advanced right hip degenerative joint disease. Stable left total hip replacement in good alignment.   Past Medical/Family/Surgical/Social History: Medications & Allergies reviewed per EMR Patient Active Problem List   Diagnosis Date Noted  . Gout 01/10/2016  . Abnormal CT scan, chest 03/21/2012  . Cough 03/20/2012  . Diabetes mellitus ([place]) 11/27/2011  . CAD (coronary artery disease) 11/27/2011  . SOB (shortness of breath) 11/19/2011  . Abnormal EKG 11/19/2011  . Pelvic relaxation 06/18/2011  . SUI (stress urinary incontinence, female) 06/18/2011  . Hypothyroidism 04/10/2007  . Hyperlipidemia 04/10/2007  .  Essential hypertension 04/10/2007  . OSTEOARTHRITIS 04/10/2007   Past Medical History:  Diagnosis Date  . Allergy   . Anemia   . Bacterial infection   . Diabetes mellitus   . H/O bleeding disorder   . Hypertension   . Leaking of urine   . Prolapsed bladder   . Thyroid disease   . Varicose veins   . Yeast infection    Family History  Problem Relation Age of Onset  . Heart disease Mother   . Stroke Mother   . Heart disease Father   . Stroke Father   . Heart disease Sister   .  Stroke Sister    Past Surgical History:  Procedure Laterality Date  . ABDOMINAL HYSTERECTOMY    . JOINT REPLACEMENT    . OVARIAN CYST REMOVAL    . TONSILLECTOMY     Social History   Occupational History  . Retired Retired   Social History Main Topics  . Smoking status: Former Smoker    Packs/day: 1.50    Years: 20.00    Types: Cigarettes    Quit date: 04/01/1996  . Smokeless tobacco: Never Used  . Alcohol use No  . Drug use: No  . Sexual activity: Not Currently

## 2016-10-08 NOTE — Addendum Note (Signed)
Addended by: Raymondo Band on: 10/08/2016 05:34 PM   Modules accepted: Orders

## 2016-10-09 ENCOUNTER — Telehealth: Payer: Self-pay | Admitting: Physician Assistant

## 2016-10-09 NOTE — Telephone Encounter (Signed)
PATIENT STATES SHE SAW STEPHANIE LAST Thursday (10/03/16) AND SHE DID A LOT OF LAB WORK ON HER. SHE WOULD LIKE TO GET THE RESULTS PLEASE. BEST PHONE [phone] (HOME) PHARMACY CHOICE IF NEEDED IS [facility] ON [address]. [place]

## 2016-10-09 NOTE — Telephone Encounter (Signed)
Can you please release lab results ?

## 2016-10-09 NOTE — Telephone Encounter (Signed)
Labs given to patient.  Ok to refill the lisinopril for 1 year if she reports that she is not having any coughing with her symptoms.  Right now she is fine with the dosing 3 days later, but I advised to give it 1 week to know for sure.

## 2016-10-21 ENCOUNTER — Ambulatory Visit (INDEPENDENT_AMBULATORY_CARE_PROVIDER_SITE_OTHER): Payer: Medicare Other | Admitting: Physician Assistant

## 2016-10-21 ENCOUNTER — Encounter: Payer: Self-pay | Admitting: Physician Assistant

## 2016-10-21 VITALS — BP 133/68 | HR 63 | Temp 98.3°F | Resp 18 | Ht 64.0 in | Wt 181.2 lb

## 2016-10-21 DIAGNOSIS — T63441A Toxic effect of venom of bees, accidental (unintentional), initial encounter: Secondary | ICD-10-CM

## 2016-10-21 MED ORDER — TRIAMCINOLONE ACETONIDE 0.1 % EX CREA: 1.0000 "application " | TOPICAL_CREAM | Freq: Two times a day (BID) | CUTANEOUS | 0 refills | Status: AC

## 2016-10-21 NOTE — Patient Instructions (Addendum)
Insect Bite, Adult An insect bite can make your skin red, itchy, and swollen. Some insects can spread disease to people with a bite. However, most insect bites do not lead to disease, and most are not serious. Follow these instructions at home: Bite area care  Do not scratch the bite area.  Keep the bite area clean and dry.  Wash the bite area every day with soap and water as told by your doctor.  Check the bite area every day for signs of infection. Check for: ? More redness, swelling, or pain. ? Fluid or blood. ? Warmth. ? Pus. Managing pain, itching, and swelling  You may put any of these on the bite area as told by your doctor: ? A baking soda paste. ? Cortisone cream. ? Calamine lotion.  If directed, put ice on the bite area. ? Put ice in a plastic bag. ? Place a towel between your skin and the bag. ? Leave the ice on for 20 minutes, 2-3 times a day. Medicines  Take medicines or put medicines on your skin only as told by your doctor.  If you were prescribed an antibiotic medicine, use it as told by your doctor. Do not stop using the antibiotic even if your condition improves. General instructions  Keep all follow-up visits as told by your doctor. This is important. How is this prevented? To help you have a lower risk of insect bites:  When you are outside, wear clothing that covers your arms and legs.  Use insect repellent. The best insect repellents have: ? An active ingredient of DEET, picaridin, oil of lemon eucalyptus (OLE), or IR3535. ? Higher amounts of DEET or another active ingredient than other repellents have.  If your home windows do not have screens, think about putting some in.  Contact a doctor if:  You have more redness, swelling, or pain in the bite area.  You have fluid, blood, or pus coming from the bite area.  The bite area feels warm.  You have a fever. Get help right away if:  You have joint pain.  You have a rash.  You have  shortness of breath.  You feel more tired or sleepy than you normally do.  You have neck pain.  You have a headache.  You feel weaker than you normally do.  You have chest pain.  You have pain in your belly.  You feel sick to your stomach (nauseous) or you throw up (vomit). Summary  An insect bite can make your skin red, itchy, and swollen.  Do not scratch the bite area, and keep it clean and dry.  Ice can help with pain and itching from the bite. This information is not intended to replace advice given to you by your health care provider. Make sure you discuss any questions you have with your health care provider. Document Released: 03/15/2000 Document Revised: 10/19/2015 Document Reviewed: 08/03/2014 Elsevier Interactive Patient Education  2018 [employer].    IF you received an x-ray today, you will receive an invoice from [facility]. Please contact [facility] at [phone] with questions or concerns regarding your invoice.   IF you received labwork today, you will receive an invoice from [facility]. Please contact [facility] at [phone] with questions or concerns regarding your invoice.   Our billing staff will not be able to assist you with questions regarding bills from these companies.  You will be contacted with the lab results as soon as they are available. The fastest  way to get your results is to activate your My Chart account. Instructions are located on the last page of this paperwork. If you have not heard from [place] regarding the results in 2 weeks, please contact this office.

## 2016-10-22 NOTE — Progress Notes (Signed)
PRIMARY CARE AT [facility] [address] [place] [address] [phone]  Date:  10/21/2016   Name:  Olivia Robinson   DOB:  [birth year]   MRN:  [id number]  PCP:  Joretta Bachelor, PA    History of Present Illness:  Olivia Robinson is a 82 y.o. female patient who presents to PCP with  Chief Complaint  Patient presents with  . Insect Bite    bee sting on left arm and elbow is red and has swelling      Patient reports that about 3 days ago, she was stung by a bee in her garage, on her left arm.  She had noticed that she had increased swelling and redness that had progressively worsened over 2 days.  Today, she has noted that the swelling and redness has improved.  She has no pain but is intensely pruritic.  She has full mobility of her arm.  No fever or malaise.  No drainage from the site.  No sob or dyspnea.  She states that she is allergic to bee stings.  Last bee sting were decades ago.   Patient Active Problem List   Diagnosis Date Noted  . Gout 01/10/2016  . Abnormal CT scan, chest 03/21/2012  . Cough 03/20/2012  . Diabetes mellitus ([place]) 11/27/2011  . CAD (coronary artery disease) 11/27/2011  . SOB (shortness of breath) 11/19/2011  . Abnormal EKG 11/19/2011  . Pelvic relaxation 06/18/2011  . SUI (stress urinary incontinence, female) 06/18/2011  . Hypothyroidism 04/10/2007  . Hyperlipidemia 04/10/2007  . Essential hypertension 04/10/2007  . OSTEOARTHRITIS 04/10/2007    Past Medical History:  Diagnosis Date  . Allergy   . Anemia   . Bacterial infection   . Diabetes mellitus   . H/O bleeding disorder   . Hypertension   . Leaking of urine   . Prolapsed bladder   . Thyroid disease   . Varicose veins   . Yeast infection     Past Surgical History:  Procedure Laterality Date  . ABDOMINAL HYSTERECTOMY    . JOINT REPLACEMENT    . OVARIAN CYST REMOVAL    . TONSILLECTOMY      Social History  Substance Use Topics  . Smoking status: Former Smoker   Packs/day: 1.50    Years: 20.00    Types: Cigarettes    Quit date: 04/01/1996  . Smokeless tobacco: Never Used  . Alcohol use No    Family History  Problem Relation Age of Onset  . Heart disease Mother   . Stroke Mother   . Heart disease Father   . Stroke Father   . Heart disease Sister   . Stroke Sister     Allergies  Allergen Reactions  . Ampicillin   . Clindamycin     REACTION: diarrhea  . Nsaids Other (See Comments)    GI Bleed     . Sulfa Antibiotics     Medication list has been reviewed and updated.  Current Outpatient Prescriptions on File Prior to Visit  Medication Sig Dispense Refill  . allopurinol (ZYLOPRIM) 100 MG tablet One daily 90 tablet 3  . Ascorbic Acid (VITAMIN C) 100 MG tablet Take 1,000 mg by mouth 2 (two) times daily.     . cetirizine (ZYRTEC) 10 MG tablet Take 10 mg by mouth daily.    . Cholecalciferol (VITAMIN D PO) Take 1 tablet by mouth daily.    Marland Kitchen CRANBERRY FRUIT PO Take by mouth.    . fluticasone (FLONASE)  50 MCG/ACT nasal spray Place 2 sprays into the nose daily as needed.    Marland Kitchen levothyroxine (SYNTHROID, LEVOTHROID) 125 MCG tablet One daily 90 tablet 3  . lisinopril (PRINIVIL,ZESTRIL) 20 MG tablet Take 1 tablet (20 mg total) by mouth daily. 30 tablet 0  . metFORMIN (GLUCOPHAGE) 1000 MG tablet Take 1 tablet (1,000 mg total) by mouth 2 (two) times daily with a meal. 180 tablet 3  . OVER THE COUNTER MEDICATION     . Probiotic Product (PROBIOTIC DAILY PO) Take 1 tablet by mouth daily.     No current facility-administered medications on file prior to visit.     ROS ROS otherwise unremarkable unless listed above.  Physical Examination: BP 133/68   Pulse 63   Temp 98.3 F (36.8 C) (Oral)   Resp 18   Ht 5\' 4"  (1.626 m)   Wt 181 lb 3.2 oz (82.2 kg)   SpO2 97%   BMI 31.10 kg/m  Ideal Body Weight: Weight in (lb) to have BMI = 25: 145.3  Physical Exam  Constitutional: She is oriented to person, place, and time. She appears well-developed  and well-nourished. No distress.  HENT:  Head: Normocephalic and atraumatic.  Right Ear: External ear normal.  Left Ear: External ear normal.  Eyes: Pupils are equal, round, and reactive to light. Conjunctivae and EOM are normal.  Cardiovascular: Normal rate and regular rhythm.  Exam reveals no friction rub.   No murmur heard. Pulmonary/Chest: Effort normal. No respiratory distress.  Neurological: She is alert and oriented to person, place, and time.  Skin: She is not diaphoretic.  Psychiatric: She has a normal mood and affect. Her behavior is normal.     Assessment and Plan: Olivia Robinson is a 82 y.o. female who is here today for cc of bee sting. Advised triamcinolone acetonide.  Outlined and alarming sxs discussed to warrant an immediate return or to the ED.  She voiced understanding.   She does not want an epi-pen that she has expired, due to cost. Bee sting, accidental or unintentional, initial encounter - Plan: triamcinolone cream (KENALOG) 0.1 %  Ivar Drape, PA-C [facility] 7/24/20189:04 AM

## 2016-10-31 ENCOUNTER — Other Ambulatory Visit: Payer: Self-pay | Admitting: Physician Assistant

## 2016-10-31 DIAGNOSIS — I10 Essential (primary) hypertension: Secondary | ICD-10-CM

## 2016-11-28 ENCOUNTER — Other Ambulatory Visit: Payer: Self-pay | Admitting: Physician Assistant

## 2016-11-28 DIAGNOSIS — I10 Essential (primary) hypertension: Secondary | ICD-10-CM

## 2016-12-05 ENCOUNTER — Telehealth: Payer: Self-pay | Admitting: Family Medicine

## 2017-01-15 DIAGNOSIS — Z23 Encounter for immunization: Secondary | ICD-10-CM | POA: Diagnosis not present

## 2017-03-03 ENCOUNTER — Other Ambulatory Visit: Payer: Self-pay | Admitting: Physician Assistant

## 2017-03-03 DIAGNOSIS — I10 Essential (primary) hypertension: Secondary | ICD-10-CM

## 2017-04-18 DIAGNOSIS — N390 Urinary tract infection, site not specified: Secondary | ICD-10-CM | POA: Diagnosis not present

## 2017-04-18 DIAGNOSIS — R399 Unspecified symptoms and signs involving the genitourinary system: Secondary | ICD-10-CM | POA: Diagnosis not present

## 2017-04-30 DIAGNOSIS — H1855 Macular corneal dystrophy: Secondary | ICD-10-CM | POA: Diagnosis not present

## 2017-06-04 ENCOUNTER — Other Ambulatory Visit: Payer: Self-pay | Admitting: Physician Assistant

## 2017-06-04 DIAGNOSIS — I10 Essential (primary) hypertension: Secondary | ICD-10-CM

## 2017-06-04 NOTE — Telephone Encounter (Signed)
Copied from [place]. Topic: Quick Communication - Rx Refill/Question >> Jun 04, 2017  3:10 PM Scherrie Gerlach wrote: Medication: lisinopril (PRINIVIL,ZESTRIL) 20 MG tablet  Pt has moved to [place] and does not have a dr there yet. Pt is hoping to get this refilled  90 days to give her time to get a dr. [facility] [place], [place] [phone]

## 2017-06-05 NOTE — Telephone Encounter (Signed)
Lisinopril refill request  Pt has moved to [place] and does not have a dr there yet.   Pt is hoping to get this refilled 90 days to give her time to get a dr there.  LOV 10/03/16 with Ivar Drape.  Note the pharmacy is:  [facility] [place] [address] [phone]

## 2017-06-06 MED ORDER — LISINOPRIL 20 MG PO TABS: 20.0000 mg | ORAL_TABLET | Freq: Every day | ORAL | 0 refills | Status: AC

## 2017-06-06 NOTE — Telephone Encounter (Signed)
Refilled medication

## 2017-07-02 ENCOUNTER — Encounter: Payer: Self-pay | Admitting: Physician Assistant

## 2017-09-01 DIAGNOSIS — E119 Type 2 diabetes mellitus without complications: Secondary | ICD-10-CM | POA: Diagnosis not present

## 2017-09-01 DIAGNOSIS — I10 Essential (primary) hypertension: Secondary | ICD-10-CM | POA: Diagnosis not present

## 2017-09-01 DIAGNOSIS — E6609 Other obesity due to excess calories: Secondary | ICD-10-CM | POA: Diagnosis not present

## 2017-09-01 DIAGNOSIS — E039 Hypothyroidism, unspecified: Secondary | ICD-10-CM | POA: Diagnosis not present

## 2017-09-03 DIAGNOSIS — I10 Essential (primary) hypertension: Secondary | ICD-10-CM | POA: Diagnosis not present

## 2017-09-03 DIAGNOSIS — E119 Type 2 diabetes mellitus without complications: Secondary | ICD-10-CM | POA: Diagnosis not present

## 2017-09-03 DIAGNOSIS — E039 Hypothyroidism, unspecified: Secondary | ICD-10-CM | POA: Diagnosis not present

## 2017-09-08 DIAGNOSIS — E782 Mixed hyperlipidemia: Secondary | ICD-10-CM | POA: Diagnosis not present

## 2017-09-08 DIAGNOSIS — E6609 Other obesity due to excess calories: Secondary | ICD-10-CM | POA: Diagnosis not present

## 2017-09-08 DIAGNOSIS — E119 Type 2 diabetes mellitus without complications: Secondary | ICD-10-CM | POA: Diagnosis not present

## 2017-09-08 DIAGNOSIS — R809 Proteinuria, unspecified: Secondary | ICD-10-CM | POA: Diagnosis not present

## 2017-09-08 DIAGNOSIS — Z0001 Encounter for general adult medical examination with abnormal findings: Secondary | ICD-10-CM | POA: Diagnosis not present

## 2017-09-08 DIAGNOSIS — E039 Hypothyroidism, unspecified: Secondary | ICD-10-CM | POA: Diagnosis not present

## 2017-09-08 DIAGNOSIS — I10 Essential (primary) hypertension: Secondary | ICD-10-CM | POA: Diagnosis not present

## 2017-12-12 DIAGNOSIS — E782 Mixed hyperlipidemia: Secondary | ICD-10-CM | POA: Diagnosis not present

## 2017-12-12 DIAGNOSIS — E119 Type 2 diabetes mellitus without complications: Secondary | ICD-10-CM | POA: Diagnosis not present

## 2017-12-12 DIAGNOSIS — R809 Proteinuria, unspecified: Secondary | ICD-10-CM | POA: Diagnosis not present

## 2017-12-15 DIAGNOSIS — E039 Hypothyroidism, unspecified: Secondary | ICD-10-CM | POA: Diagnosis not present

## 2017-12-15 DIAGNOSIS — R809 Proteinuria, unspecified: Secondary | ICD-10-CM | POA: Diagnosis not present

## 2017-12-15 DIAGNOSIS — E6609 Other obesity due to excess calories: Secondary | ICD-10-CM | POA: Diagnosis not present

## 2017-12-15 DIAGNOSIS — Z23 Encounter for immunization: Secondary | ICD-10-CM | POA: Diagnosis not present

## 2017-12-15 DIAGNOSIS — E119 Type 2 diabetes mellitus without complications: Secondary | ICD-10-CM | POA: Diagnosis not present

## 2017-12-15 DIAGNOSIS — E782 Mixed hyperlipidemia: Secondary | ICD-10-CM | POA: Diagnosis not present

## 2017-12-15 DIAGNOSIS — I10 Essential (primary) hypertension: Secondary | ICD-10-CM | POA: Diagnosis not present
